# Patient Record
Sex: Female | Born: 1935 | Race: White | Hispanic: No | Smoking: Current every day smoker
Health system: Southern US, Community
[De-identification: ages and names within clinical notes are randomized; demographics above are authoritative.]

## PROBLEM LIST (undated history)

## (undated) DIAGNOSIS — Z950 Presence of cardiac pacemaker: Secondary | ICD-10-CM

## (undated) DIAGNOSIS — J449 Chronic obstructive pulmonary disease, unspecified: Secondary | ICD-10-CM

## (undated) DIAGNOSIS — I10 Essential (primary) hypertension: Secondary | ICD-10-CM

## (undated) DIAGNOSIS — I509 Heart failure, unspecified: Secondary | ICD-10-CM

## (undated) DIAGNOSIS — I4891 Unspecified atrial fibrillation: Secondary | ICD-10-CM

## (undated) DIAGNOSIS — N189 Chronic kidney disease, unspecified: Secondary | ICD-10-CM

## (undated) HISTORY — PX: HIP ARTHROPLASTY: SHX981

## (undated) HISTORY — PX: PACEMAKER PLACEMENT: SHX43

---

## 2003-10-03 ENCOUNTER — Other Ambulatory Visit: Payer: Self-pay

## 2003-10-04 ENCOUNTER — Other Ambulatory Visit: Payer: Self-pay

## 2003-10-05 ENCOUNTER — Other Ambulatory Visit: Payer: Self-pay

## 2003-11-15 ENCOUNTER — Ambulatory Visit: Payer: Self-pay | Admitting: Internal Medicine

## 2003-11-19 ENCOUNTER — Inpatient Hospital Stay: Payer: Self-pay | Admitting: Cardiology

## 2003-11-19 ENCOUNTER — Other Ambulatory Visit: Payer: Self-pay

## 2003-11-20 ENCOUNTER — Other Ambulatory Visit: Payer: Self-pay

## 2004-02-10 ENCOUNTER — Ambulatory Visit: Payer: Self-pay | Admitting: General Surgery

## 2004-07-08 ENCOUNTER — Ambulatory Visit: Payer: Self-pay | Admitting: Internal Medicine

## 2005-05-19 ENCOUNTER — Ambulatory Visit: Payer: Self-pay | Admitting: Unknown Physician Specialty

## 2005-09-02 ENCOUNTER — Ambulatory Visit: Payer: Self-pay | Admitting: Internal Medicine

## 2006-08-17 ENCOUNTER — Ambulatory Visit: Payer: Self-pay | Admitting: Ophthalmology

## 2006-08-17 ENCOUNTER — Other Ambulatory Visit: Payer: Self-pay

## 2006-08-24 ENCOUNTER — Ambulatory Visit: Payer: Self-pay | Admitting: Ophthalmology

## 2006-10-27 ENCOUNTER — Ambulatory Visit: Payer: Self-pay | Admitting: Internal Medicine

## 2007-10-30 ENCOUNTER — Ambulatory Visit: Payer: Self-pay | Admitting: Internal Medicine

## 2008-10-31 ENCOUNTER — Ambulatory Visit: Payer: Self-pay | Admitting: Internal Medicine

## 2010-01-20 ENCOUNTER — Ambulatory Visit: Payer: Self-pay | Admitting: Internal Medicine

## 2011-02-13 ENCOUNTER — Inpatient Hospital Stay: Payer: Self-pay | Admitting: Internal Medicine

## 2011-02-13 LAB — COMPREHENSIVE METABOLIC PANEL
Anion Gap: 11 (ref 7–16)
Bilirubin,Total: 0.5 mg/dL (ref 0.2–1.0)
EGFR (African American): >60
Glucose: 148 mg/dL — ABNORMAL HIGH (ref 65–99)
Osmolality: 281 (ref 275–301)
Potassium: 3.2 mmol/L — ABNORMAL LOW (ref 3.5–5.1)
Sodium: 138 mmol/L (ref 136–145)
Total Protein: 7.7 g/dL (ref 6.4–8.2)

## 2011-02-13 LAB — CBC
HCT: 45.3 % (ref 35.0–47.0)
HGB: 15.7 g/dL (ref 12.0–16.0)
MCH: 31 pg (ref 26.0–34.0)
MCV: 90 fL (ref 80–100)
RBC: 5.05 10*6/uL (ref 3.80–5.20)

## 2011-02-14 LAB — CK-MB
CK-MB: 5.4 ng/mL — ABNORMAL HIGH (ref 0.5–3.6)
CK-MB: 5.9 ng/mL — ABNORMAL HIGH (ref 0.5–3.6)
CK-MB: 6.7 ng/mL — ABNORMAL HIGH (ref 0.5–3.6)

## 2011-02-14 LAB — TROPONIN I
Troponin-I: 0.13 ng/mL — ABNORMAL HIGH
Troponin-I: 0.11 ng/mL — ABNORMAL HIGH

## 2011-02-15 LAB — BASIC METABOLIC PANEL
Calcium, Total: 9.2 mg/dL (ref 8.5–10.1)
Chloride: 101 mmol/L (ref 98–107)
Co2: 30 mmol/L (ref 21–32)
Osmolality: 288 (ref 275–301)
Potassium: 3.7 mmol/L (ref 3.5–5.1)

## 2011-02-15 LAB — CBC WITH DIFFERENTIAL/PLATELET
Basophil %: 0.3 %
Eosinophil %: 0 %
HCT: 40.5 % (ref 35.0–47.0)
Lymphocyte #: 1.4 10*3/uL (ref 1.0–3.6)
MCH: 29.9 pg (ref 26.0–34.0)
MCHC: 33 g/dL (ref 32.0–36.0)
MCV: 91 fL (ref 80–100)
Monocyte #: 0.8 10*3/uL — ABNORMAL HIGH (ref 0.0–0.7)
Monocyte %: 7.5 %
RDW: 15.8 % — ABNORMAL HIGH (ref 11.5–14.5)

## 2011-02-15 LAB — DIGOXIN LEVEL: Digoxin: <0.1 ng/mL — ABNORMAL LOW

## 2011-02-15 LAB — CLOSTRIDIUM DIFFICILE BY PCR

## 2011-02-17 LAB — BASIC METABOLIC PANEL
BUN: 29 mg/dL — ABNORMAL HIGH (ref 7–18)
Calcium, Total: 9.7 mg/dL (ref 8.5–10.1)
Chloride: 105 mmol/L (ref 98–107)
Creatinine: 1.2 mg/dL (ref 0.60–1.30)
EGFR (African American): 55 — ABNORMAL LOW
EGFR (Non-African Amer.): 45 — ABNORMAL LOW
Glucose: 115 mg/dL — ABNORMAL HIGH (ref 65–99)
Potassium: 3.5 mmol/L (ref 3.5–5.1)
Sodium: 142 mmol/L (ref 136–145)

## 2011-02-17 LAB — CBC WITH DIFFERENTIAL/PLATELET
Basophil #: 0 10*3/uL (ref 0.0–0.1)
Basophil %: 0 %
Eosinophil %: 0 %
HGB: 12.6 g/dL (ref 12.0–16.0)
Lymphocyte #: 0.9 10*3/uL — ABNORMAL LOW (ref 1.0–3.6)
Lymphocyte %: 6.6 %
Monocyte %: 4.3 %
Neutrophil %: 89.1 %
Platelet: 162 10*3/uL (ref 150–440)
RBC: 4.22 10*6/uL (ref 3.80–5.20)
RDW: 16.2 % — ABNORMAL HIGH (ref 11.5–14.5)
WBC: 13 10*3/uL — ABNORMAL HIGH (ref 3.6–11.0)

## 2011-02-22 ENCOUNTER — Ambulatory Visit: Payer: Self-pay | Admitting: Internal Medicine

## 2012-05-02 ENCOUNTER — Ambulatory Visit: Payer: Self-pay | Admitting: Internal Medicine

## 2013-10-09 ENCOUNTER — Ambulatory Visit: Payer: Self-pay | Admitting: Family Medicine

## 2014-05-26 NOTE — H&P (Signed)
PATIENT NAME:  Erin Macias, Erin Macias MR#:  [id number] DATE OF BIRTH:  Dec 11, 1923  DATE OF ADMISSION:  02/13/2011  PRIMARY CARE PHYSICIAN: Alonna BucklerAndrew Lamb, MD   CARDIOLOGIST: Dr. Gwen PoundsKowalski   CHIEF COMPLAINT: Congestion and cough for 2 to 3 days.   HISTORY OF PRESENT ILLNESS: Erin Macias is an 81 year old Caucasian female with past medical history of hypertension, glaucoma, and history of sick sinus syndrome status post dual-chamber pacemaker in 2005 and nonsignificant coronary artery disease by cardiac cath 11/2003 who comes into the Emergency Room accompanied by family members with the above-mentioned chief complaint. The patient said in the early part of December she had the cold and cough symptoms, got better, however, for the past two days she has been having symptoms of sinus congestion, myalgias, low-grade fever along with dry cough with which she is not able to bring up any phlegm. She feels quite stuffed up. She called Dr. Jasper LoserLamb's office and Z-Pak was called in. She has taken two days of medication, not feeling better, and came to the Emergency Room where chest x-ray is negative for pneumonia. She was, however, hypoxic when she was taken off her oxygen. Sats dropped down into the upper 80's. She is being admitted for flulike symptoms along with hypoxemia.   The patient is a chronic smoker, about 5 to 7 cigarettes a day, for many years. She has no official diagnosis of chronic obstructive pulmonary disease. She denies any wheezing.   PAST MEDICAL HISTORY:  1. Sick sinus syndrome status post pacemaker in 11/2003 along with nonsignificant coronary artery disease by cardiac cath 11/2003, followed by Dr. Gwen PoundsKowalski.  2. Hypertension.  3. Moderate segmental systolic dysfunction with valvular heart disease.  4. Hypertension.  5. Glaucoma.  6. Hyperlipidemia.  7. Osteoporosis.  8. History of basal cell carcinoma.   PAST SURGICAL HISTORY:  1. Right breast biopsy.  2. Bunionectomy.  3. Tonsillectomy.   4. Cervical laminectomy.  5. Pacemaker placement.   MEDICATIONS:   1. Amlodipine 10 mg daily.  2. Lisinopril/hydrochlorothiazide 10/12.5 2 tablets daily.  3. MVI daily.  4. Toprol-XL 100 mg daily.  5. Aldactone 25 mg daily. This was just started on 02/11/2011 by Dr. Gwen PoundsKowalski. The patient has not started to take it yet.  6. Aspirin 81 mg daily.  7. Calcium 600 b.i.d.  8. Evista 60 mg daily.  FAMILY HISTORY: Father died of colon cancer. Mother died in her 9270's. She had breast cancer, hypertension, arthritis. There is history of CVA positive in the family.   SOCIAL HISTORY: She is retired. Smokes about 5 to 6 cigarettes a day for many years. Has 2 or 3 cups of caffeinated coffee a day.   REVIEW OF SYSTEMS: Positive for fever, fatigue. EYES: No blurred or double vision. ENT: Positive for sinus congestion and cough. RESPIRATORY: Positive for cough and shortness of breath. CARDIOVASCULAR: No chest pain. Positive for hypertension. GI: No nausea, vomiting, diarrhea, or abdominal pain. GU: No dysuria or hematuria. ENDOCRINE: No polyuria or nocturia. HEMATOLOGY: No anemia or easy bruising. SKIN: No acne or rash. MUSCULOSKELETAL: Positive for arthritis. NEUROLOGIC: No cerebrovascular accident or transient ischemic attack. PSYCH: No anxiety or depression. All other systems reviewed and negative.   PHYSICAL EXAMINATION:   GENERAL: The patient is awake. She is alert, she is oriented, not in acute distress.   VITAL SIGNS: Afebrile, pulse 98, blood pressure 135/65. Her sats are 98% on 2 liters.   HEENT: Atraumatic, normocephalic. The patient does have sinus congestion bilaterally with swollen eyelids  and hoarse voice.   NECK: Supple. No JVD. No carotid bruit.   RESPIRATORY: Mildly upper airway coarse breath sounds heard. No wheezing or crackles.   CARDIOVASCULAR: Tachycardia present. No murmur heard. PMI not lateralized. Chest nontender.   EXTREMITIES: Good pedal pulses. Good femoral pulses. No  lower extremity edema.   ABDOMEN: Soft, benign, nontender. No organomegaly. Positive bowel sounds.   NEUROLOGIC: Grossly intact cranial nerves II through XII. No motor or sensory deficit.   PSYCHIATRIC: The patient is awake, alert, oriented x3.   SKIN: Warm and dry.   LABORATORY, DIAGNOSTIC, AND RADIOLOGICAL DATA: Influenza A + B is negative. CBC within normal limits. Platelet count 140. Glucose 148. BUN 19. Creatinine 1. Sodium 138. Potassium 3.2. Chloride 98. SGOT 46. Chest x-ray subtle reticular opacities in the periphery of the lungs are nonspecific. This may represent interstitial edema. Early infection would be difficult to exclude. Chronic interstitial lung disease is a differential consideration.   ASSESSMENT: 81 year old Ms. Boomhower with:  1. Upper respiratory infection/flulike illness with negative Influenza in the ER.  2. Acute bronchitis.  3. Hypoxia with history of chronic smoking. No formal diagnosis of chronic obstructive pulmonary disease.  4. Hypertension.  5. Sick sinus syndrome status post pacemaker in 2005.  6. Cardiomyopathy with EF of 35% by echo.   PLAN:  1. Admit patient to medical floor.  2. Regular diet.  3. Will continue the patient's Zithromax.  4. Add benzonatate and Mucinex for congestion and cough.  5. Continue all home medications.  6. P.r.n. oral inhalers. 7. Wean oxygen as you can.  8. There is no indication for high doses of IV Solu-Medrol. The patient is not wheezing and currently not in respiratory distress. This appears to be all upper respiratory infection. I will hold off on it for now.  9. Send sputum culture.  10. Further work-up according to the patient's clinical course.   Hospital admission plan was discussed with the patient and her family members.   TIME SPENT: 45 minutes.  ____________________________ Wylie Hail Allena Katz, MD sap:drc D: 02/13/2011 12:14:18 ET T: 02/13/2011 12:34:54 ET JOB#: [id number]  cc: Darcel Frane A. Allena Katz, MD,  <Dictator> Reola Mosher. Randa Lynn, MD Willow Ora MD ELECTRONICALLY SIGNED 02/16/2011 15:25

## 2014-05-26 NOTE — Discharge Summary (Signed)
PATIENT NAME:  Erin Macias, TANKARD MR#:  [id number] DATE OF BIRTH:  04-08-1923  DATE OF ADMISSION:  02/13/2011 DATE OF DISCHARGE:  02/18/2011  DISCHARGE DIAGNOSES:  1. Community-acquired pneumonia with chronic obstructive pulmonary disease exacerbation.  2. Atrial fibrillation with rapid ventricular response because of pneumonia and chronic obstructive pulmonary disease, resolved, now in sinus rhythm.  3. Hypertension.  4. Hyperlipidemia.  5. Sick sinus syndrome.  6. Coronary artery disease.  DISCHARGE MEDICATIONS:  1. Aspirin 81 mg p.o. daily.  2. Amlodipine 5 mg p.o. daily.  3. HCTZ/lisinopril 12.5/10 mg p.o. b.i.d.  4. Spiriva 1 capsule inhalation daily. 5. Advair 250/50, 1 puff b.i.d.  6. Augmentin 875 mg p.o. b.i.d. for five days. 7. Prednisone taper as instructed.  8. Lopressor 50 mg every 12 hours. 9. Cardizem CD 120 mg p.o. daily.  10. Patient actually was given  tussiones but  she is allergic to codeine so the Tussionex prescription is discarded.    DIET: Low sodium diet.   FOLLOW UP: Patient has follow-up appointment with Dr. [name] on 30th at 9:15 a.m. and also primary doctor, Dr. Randa Lynn, she will see him  on 01/31    CONSULTATION: Cardiology consult with Dr. [name].    HOSPITAL COURSE: 81 year old female patient who has a history of hypertension and sick sinus syndrome, hyperlipidemia and history of renal cell carcinoma and tobacco abuse came in because of:  1. Cough, chest congestion and flulike symptoms. Patient also had saturations in upper 80s on room air. She was given Z-Pak by primary doctor because of persistent symptoms. Patient was brought to the Emergency Room. She has history of pipe smoking about 5 to 7 cigarettes a day. When she came in her heart rate was also elevated. She was admitted to hospitalist service for an acute respiratory infection, flulike symptoms and acute bronchitis. Started on IV antibiotics with Rocephin and Zithromax along with nebulizers and  steroids. Patient's influenza test was negative but she was started on Tamiflu because of the symptoms. Also continued on oxygen. Patient was seen by Dr. [name] from cardiology. Initially she was seen by Dr. Darrold Junker and Dr. [name] saw the patient. Patient is transferred to Critical Care Unit because of atrial fibrillation with rapid ventricular response and was started on Cardizem drip. Patient's atrial fibrillation was thought to be because of her respiratory distress and Cardizem is changed to 60 q.6 and gradually she is on Cardizem CD 120 mg daily which is according to the cardiologist recommendations and she is also continued on Lopressor. Heart rate has been stabilized prior to discharge. Anticoagulation was deferred because of paroxysmal atrial fibrillation and patient's ejection fraction showed more than 50%.  2. Regarding her pneumonia and chronic obstructive pulmonary disease she also had CT of the chest done which showed emphysematous lungs and also left upper lobe inferolaterally and superior segment of left lower lobe, minimal subpleural smoothly rounded calcified nodule present in the left upper lobe and mediastinal lymphadenopathy present. Mild bronchiectasis present. Borderline cardiomegaly. Patient found to have patchy infiltrates in the left upper lobe e, so she was discharged with Spiriva, Augmentin, along with inhalers, albuterol and Advair. Patient also remained saturations above 90s on ambulation on room air so she did not require any oxygen. Also advised to stop smoking for her tobacco abuse.  3. Patient also has some electrolyte abnormality during the hospital stay which were replaced which improved hypokalemia .Marland Kitchen Patient also seen by physical therapist. Patient refused home physical therapy and also other services at  home and she was discharged to home.   DISCHARGE VITALS: Temperature 97.7, pulse 74, blood pressure 120/72, saturations 94% on room air.   CONDITION ON DISCHARGE: She  was discharged home in stable condition.   TIME SPENT ON DISCHARGE PREPARATION: More than 30 minutes.   ____________________________ Katha HammingSnehalatha Eiza Canniff, MD sk:cms D: 02/22/2011 22:45:00 ET T: 02/24/2011 09:09:13 ET JOB#: [id number]  cc: Katha HammingSnehalatha Reynolds Kittel, MD, <Dictator> Reola MosherAndrew S. Randa LynnLamb, MD  Katha HammingSNEHALATHA Mico Spark MD ELECTRONICALLY SIGNED 03/08/2011 13:35

## 2014-05-26 NOTE — Consult Note (Signed)
Brief Consult Note: Diagnosis: AF with RVR, converted to sinus on dilt drip.   Patient was seen by consultant.   Consult note dictated.   Comments: REC  Agree with current therapy, cont dilt drip, if remains in NSR, start cardizem 60mg  mQ6, then taper dilt drip to off, hold dig, defer chronic anticoagulation, review echo.  Electronic Signatures: Marcina MillardParaschos, Mandrell Vangilder (MD)  (Signed 13-Jan-13 10:31)  Authored: Brief Consult Note   Last Updated: 13-Jan-13 10:31 by Marcina MillardParaschos, Alesandro Stueve (MD)

## 2014-05-26 NOTE — Consult Note (Signed)
PATIENT NAME:  Erin Macias, FREDERIC MR#:  [id number] DATE OF BIRTH:  10-24-1923  DATE OF CONSULTATION:  02/14/2011  REFERRING PHYSICIAN:   CONSULTING PHYSICIAN:  Marcina Millard, MD  PRIMARY CARE PHYSICIAN: Alonna Buckler, MD   CARDIOLOGIST: Arnoldo Hooker, MD   CHIEF COMPLAINT: Cough.   HISTORY OF PRESENT ILLNESS: The patient is an 81 year old female referred for evaluation of atrial fibrillation with a rapid ventricular response. The patient has a prior history of atrial fibrillation, sick sinus syndrome status post dual-chamber pacemaker. she was in her usual state of health until two days prior to admission when she started to develop increasing low-grade fever, dry cough, myalgias, and nausea with decreased p.o. intake. The patient started Z-Pak as an outpatient without effect. She presented to [facility] Emergency Room where she was noted to be hypoxic and admitted with a viral syndrome. Following admission, the patient developed atrial fibrillation with a rapid ventricular response and was transferred to the CCU where she has been treated with diltiazem drip and currently has converted to sinus rhythm with intermittent atrial sensing with ventricular pacing. The patient has ruled out for myocardial infarction with negative troponin. She currently denies chest pain, shortness of breath, or tachycardia.   PAST MEDICAL HISTORY:  1. Intermittent atrial fibrillation.  2. Sick sinus syndrome status post dual-chamber pacemaker. 3. Insignificant coronary artery disease by cardiac catheterization 11/2003.  4. Hypertension.  5. Moderately reduced left ventricular function.  6. Hyperlipidemia.   MEDICATIONS ON ADMISSION:  1. Amlodipine 10 mg daily.  2. Lisinopril/HCTZ 10/12.5 2 tabs daily.  3. Toprol XL 100 mg daily.  4. Aldactone 25 mg daily.  5. Aspirin 81 mg daily.  6. Multivite 1 daily.  7. Calcium 600 mg b.i.d.  8. Evista 60 mg daily.   SOCIAL HISTORY: The patient is retired. She lives alone.  She smokes 5 to 6 cigarettes per day.   FAMILY HISTORY: No immediate family history for myocardial infarction or coronary artery disease.   REVIEW OF SYSTEMS: CONSTITUTIONAL: Positive for fever and chills. EYES: No blurry vision. EARS: No hearing loss. RESPIRATORY: Dry nonproductive cough. CARDIOVASCULAR: No chest pain. GI: Recent nausea. No vomiting, diarrhea, or constipation. GU: No dysuria or hematuria. ENDOCRINE: No polyuria or polydipsia. MUSCULOSKELETAL: Positive for arthralgias and myalgias. NEUROLOGIC: No focal muscle weakness or numbness. PSYCHOLOGICAL: No anxiety or depression.   PHYSICAL EXAMINATION:   VITAL SIGNS: Blood pressure 127/50, pulse 98, respirations 28, temperature 98.2, pulse oximetry 94%.   HEENT: Pupils equal, reactive to light and accommodation.   NECK: Supple without thyromegaly.   LUNGS: Clear.   CARDIOVASCULAR: Normal JVP. Normal PMI. Regular rate and rhythm. Normal S1, S2. No appreciable gallop, murmur, or rub.   ABDOMEN: Soft, nontender. Pulses were intact bilaterally.   MUSCULOSKELETAL: Normal muscle tone.   NEUROLOGIC: The patient is alert and oriented x3. Motor and sensory both grossly intact.   IMPRESSION: This is an 81 year old female with prior history of intermittent atrial fibrillation, sick sinus syndrome status post dual-chamber pacemaker admitted with viral syndrome with brief episode of atrial fibrillation with rapid ventricular response converted to sinus rhythm.   RECOMMENDATIONS:  1. Agree with overall current therapy.  2. Continue diltiazem drip for now.  3. If the patient remains in sinus rhythm, start Cardizem 60 mg p.o. q.6 then diltiazem drip to off. 4. Discontinue amlodipine.  5. Will defer chronic anticoagulation at this time since the patient developed atrial fibrillation during a reversible illness.  6. Review 2-D echocardiogram.  7. Would recommend  not starting digoxin at this time since the patient is in sinus rhythm.   8. Further recommendations pending echocardiogram results.   ____________________________ Marcina MillardAlexander Jazlene Bares, MD ap:drc D: 02/14/2011 10:27:18 ET T: 02/14/2011 11:15:59 ET JOB#: [id number]  cc: Marcina MillardAlexander Oreoluwa Aigner, MD, <Dictator> Marcina MillardALEXANDER Clarrissa Shimkus MD ELECTRONICALLY SIGNED 02/25/2011 12:37

## 2015-07-31 ENCOUNTER — Emergency Department: Payer: Medicare Other

## 2015-07-31 ENCOUNTER — Encounter: Payer: Self-pay | Admitting: *Deleted

## 2015-07-31 ENCOUNTER — Observation Stay
Admission: EM | Admit: 2015-07-31 | Discharge: 2015-08-02 | Disposition: A | Discharge status: Home / Self Care | Payer: Medicare Other | Attending: Internal Medicine | Admitting: Internal Medicine

## 2015-07-31 DIAGNOSIS — I509 Heart failure, unspecified: Secondary | ICD-10-CM

## 2015-07-31 DIAGNOSIS — I495 Sick sinus syndrome: Secondary | ICD-10-CM | POA: Diagnosis not present

## 2015-07-31 DIAGNOSIS — R778 Other specified abnormalities of plasma proteins: Secondary | ICD-10-CM | POA: Insufficient documentation

## 2015-07-31 DIAGNOSIS — Z85528 Personal history of other malignant neoplasm of kidney: Secondary | ICD-10-CM | POA: Diagnosis not present

## 2015-07-31 DIAGNOSIS — I2510 Atherosclerotic heart disease of native coronary artery without angina pectoris: Secondary | ICD-10-CM | POA: Insufficient documentation

## 2015-07-31 DIAGNOSIS — Z79899 Other long term (current) drug therapy: Secondary | ICD-10-CM | POA: Diagnosis not present

## 2015-07-31 DIAGNOSIS — N179 Acute kidney failure, unspecified: Secondary | ICD-10-CM | POA: Insufficient documentation

## 2015-07-31 DIAGNOSIS — I110 Hypertensive heart disease with heart failure: Secondary | ICD-10-CM | POA: Insufficient documentation

## 2015-07-31 DIAGNOSIS — I083 Combined rheumatic disorders of mitral, aortic and tricuspid valves: Secondary | ICD-10-CM | POA: Diagnosis not present

## 2015-07-31 DIAGNOSIS — Z950 Presence of cardiac pacemaker: Secondary | ICD-10-CM | POA: Diagnosis not present

## 2015-07-31 DIAGNOSIS — Z885 Allergy status to narcotic agent status: Secondary | ICD-10-CM | POA: Insufficient documentation

## 2015-07-31 DIAGNOSIS — I5033 Acute on chronic diastolic (congestive) heart failure: Secondary | ICD-10-CM | POA: Diagnosis not present

## 2015-07-31 DIAGNOSIS — F17210 Nicotine dependence, cigarettes, uncomplicated: Secondary | ICD-10-CM | POA: Diagnosis not present

## 2015-07-31 DIAGNOSIS — Z7982 Long term (current) use of aspirin: Secondary | ICD-10-CM | POA: Insufficient documentation

## 2015-07-31 DIAGNOSIS — E785 Hyperlipidemia, unspecified: Secondary | ICD-10-CM | POA: Diagnosis not present

## 2015-07-31 DIAGNOSIS — R0600 Dyspnea, unspecified: Secondary | ICD-10-CM

## 2015-07-31 DIAGNOSIS — N289 Disorder of kidney and ureter, unspecified: Secondary | ICD-10-CM

## 2015-07-31 DIAGNOSIS — M8580 Other specified disorders of bone density and structure, unspecified site: Secondary | ICD-10-CM | POA: Diagnosis not present

## 2015-07-31 DIAGNOSIS — J441 Chronic obstructive pulmonary disease with (acute) exacerbation: Principal | ICD-10-CM | POA: Insufficient documentation

## 2015-07-31 DIAGNOSIS — R7989 Other specified abnormal findings of blood chemistry: Secondary | ICD-10-CM

## 2015-07-31 DIAGNOSIS — I10 Essential (primary) hypertension: Secondary | ICD-10-CM

## 2015-07-31 HISTORY — DX: Chronic obstructive pulmonary disease, unspecified: J44.9

## 2015-07-31 HISTORY — DX: Presence of cardiac pacemaker: Z95.0

## 2015-07-31 HISTORY — DX: Heart failure, unspecified: I50.9

## 2015-07-31 LAB — CBC WITH DIFFERENTIAL/PLATELET
Basophils Absolute: 0.1 10*3/uL (ref 0–0.1)
Basophils Relative: 1 %
EOS ABS: 0 10*3/uL (ref 0–0.7)
Eosinophils Relative: 0 %
HEMATOCRIT: 46.7 % (ref 35.0–47.0)
HEMOGLOBIN: 15.6 g/dL (ref 12.0–16.0)
LYMPHS ABS: 1.2 10*3/uL (ref 1.0–3.6)
Lymphocytes Relative: 13 %
MCH: 30.1 pg (ref 26.0–34.0)
MCHC: 33.3 g/dL (ref 32.0–36.0)
MCV: 90.4 fL (ref 80.0–100.0)
MONO ABS: 0.7 10*3/uL (ref 0.2–0.9)
MONOS PCT: 7 %
NEUTROS ABS: 7.5 10*3/uL — AB (ref 1.4–6.5)
NEUTROS PCT: 79 %
Platelets: 175 10*3/uL (ref 150–440)
RBC: 5.17 MIL/uL (ref 3.80–5.20)
RDW: 17.2 % — AB (ref 11.5–14.5)
WBC: 9.4 10*3/uL (ref 3.6–11.0)

## 2015-07-31 LAB — COMPREHENSIVE METABOLIC PANEL
ALT: 35 U/L (ref 14–54)
AST: 55 U/L — AB (ref 15–41)
Albumin: 4.1 g/dL (ref 3.5–5.0)
Alkaline Phosphatase: 58 U/L (ref 38–126)
Anion gap: 10 (ref 5–15)
BILIRUBIN TOTAL: 1.5 mg/dL — AB (ref 0.3–1.2)
BUN: 54 mg/dL — AB (ref 6–20)
CALCIUM: 9.8 mg/dL (ref 8.9–10.3)
CO2: 28 mmol/L (ref 22–32)
CREATININE: 1.66 mg/dL — AB (ref 0.44–1.00)
Chloride: 101 mmol/L (ref 101–111)
GFR, EST AFRICAN AMERICAN: 30 mL/min — AB (ref >60)
GFR, EST NON AFRICAN AMERICAN: 26 mL/min — AB (ref >60)
Glucose, Bld: 97 mg/dL (ref 65–99)
Potassium: 4.4 mmol/L (ref 3.5–5.1)
Sodium: 139 mmol/L (ref 135–145)
TOTAL PROTEIN: 6.6 g/dL (ref 6.5–8.1)

## 2015-07-31 LAB — BRAIN NATRIURETIC PEPTIDE: B Natriuretic Peptide: 2449 pg/mL — ABNORMAL HIGH (ref 0.0–100.0)

## 2015-07-31 LAB — CREATININE, SERUM
Creatinine, Ser: 1.81 mg/dL — ABNORMAL HIGH (ref 0.44–1.00)
GFR, EST AFRICAN AMERICAN: 27 mL/min — AB (ref >60)
GFR, EST NON AFRICAN AMERICAN: 23 mL/min — AB (ref >60)

## 2015-07-31 LAB — CBC
HEMATOCRIT: 44 % (ref 35.0–47.0)
HEMOGLOBIN: 14.4 g/dL (ref 12.0–16.0)
MCH: 30.1 pg (ref 26.0–34.0)
MCHC: 32.7 g/dL (ref 32.0–36.0)
MCV: 92.2 fL (ref 80.0–100.0)
Platelets: 142 10*3/uL — ABNORMAL LOW (ref 150–440)
RBC: 4.77 MIL/uL (ref 3.80–5.20)
RDW: 16.9 % — ABNORMAL HIGH (ref 11.5–14.5)
WBC: 6.9 10*3/uL (ref 3.6–11.0)

## 2015-07-31 LAB — FIBRIN DERIVATIVES D-DIMER (ARMC ONLY): FIBRIN DERIVATIVES D-DIMER (ARMC): 808 — AB (ref 0–499)

## 2015-07-31 LAB — TROPONIN I
Troponin I: 0.04 ng/mL (ref <0.03)
Troponin I: 0.05 ng/mL (ref <0.03)
Troponin I: 0.04 ng/mL (ref <0.03)

## 2015-07-31 MED ORDER — SODIUM CHLORIDE 0.9 % IV SOLN: 250.0000 mL | INTRAVENOUS | Status: DC | PRN

## 2015-07-31 MED ORDER — ACETAMINOPHEN 325 MG PO TABS
650.0000 mg | ORAL_TABLET | Freq: Four times a day (QID) | ORAL | Status: DC | PRN
  Administered 2015-07-31 – 2015-08-01 (×2): 650 mg via ORAL
  Filled 2015-07-31 (×2): qty 1

## 2015-07-31 MED ORDER — ACETAMINOPHEN 650 MG RE SUPP: 650.0000 mg | Freq: Four times a day (QID) | RECTAL | Status: DC | PRN

## 2015-07-31 MED ORDER — CARVEDILOL 6.25 MG PO TABS: 3.1250 mg | ORAL_TABLET | Freq: Two times a day (BID) | ORAL | Status: DC

## 2015-07-31 MED ORDER — IPRATROPIUM-ALBUTEROL 0.5-2.5 (3) MG/3ML IN SOLN: 3.0000 mL | RESPIRATORY_TRACT | Status: DC | PRN

## 2015-07-31 MED ORDER — ASPIRIN EC 325 MG PO TBEC
325.0000 mg | DELAYED_RELEASE_TABLET | Freq: Every day | ORAL | Status: DC
  Administered 2015-07-31 – 2015-08-02 (×3): 325 mg via ORAL
  Filled 2015-07-31 (×3): qty 1

## 2015-07-31 MED ORDER — LEVOFLOXACIN 500 MG PO TABS
750.0000 mg | ORAL_TABLET | ORAL | Status: DC
  Administered 2015-07-31: 750 mg via ORAL
  Filled 2015-07-31: qty 2

## 2015-07-31 MED ORDER — ASPIRIN EC 81 MG PO TBEC: 81.0000 mg | DELAYED_RELEASE_TABLET | Freq: Every day | ORAL | Status: DC

## 2015-07-31 MED ORDER — SODIUM CHLORIDE 0.9% FLUSH
3.0000 mL | Freq: Two times a day (BID) | INTRAVENOUS | Status: DC
  Administered 2015-07-31 – 2015-08-02 (×4): 3 mL via INTRAVENOUS

## 2015-07-31 MED ORDER — ONDANSETRON HCL 4 MG PO TABS: 4.0000 mg | ORAL_TABLET | Freq: Four times a day (QID) | ORAL | Status: DC | PRN

## 2015-07-31 MED ORDER — CARVEDILOL 3.125 MG PO TABS
3.1250 mg | ORAL_TABLET | Freq: Two times a day (BID) | ORAL | Status: DC
  Administered 2015-07-31: 3.125 mg via ORAL
  Filled 2015-07-31: qty 1

## 2015-07-31 MED ORDER — BUDESONIDE 0.25 MG/2ML IN SUSP
0.2500 mg | Freq: Two times a day (BID) | RESPIRATORY_TRACT | Status: DC
  Administered 2015-07-31 – 2015-08-02 (×5): 0.25 mg via RESPIRATORY_TRACT
  Filled 2015-07-31 (×6): qty 2

## 2015-07-31 MED ORDER — ENOXAPARIN SODIUM 30 MG/0.3ML ~~LOC~~ SOLN
30.0000 mg | SUBCUTANEOUS | Status: DC
  Administered 2015-07-31: 30 mg via SUBCUTANEOUS
  Filled 2015-07-31: qty 0.3

## 2015-07-31 MED ORDER — NICOTINE 7 MG/24HR TD PT24
7.0000 mg | MEDICATED_PATCH | Freq: Every day | TRANSDERMAL | Status: DC
  Filled 2015-07-31 (×3): qty 1

## 2015-07-31 MED ORDER — SODIUM CHLORIDE 0.9% FLUSH: 3.0000 mL | INTRAVENOUS | Status: DC | PRN

## 2015-07-31 MED ORDER — IPRATROPIUM-ALBUTEROL 0.5-2.5 (3) MG/3ML IN SOLN
3.0000 mL | RESPIRATORY_TRACT | Status: DC
  Administered 2015-07-31 (×2): 3 mL via RESPIRATORY_TRACT
  Filled 2015-07-31: qty 3

## 2015-07-31 MED ORDER — NITROGLYCERIN 2 % TD OINT
1.0000 [in_us] | TOPICAL_OINTMENT | Freq: Four times a day (QID) | TRANSDERMAL | Status: DC
  Administered 2015-07-31 – 2015-08-01 (×4): 1 [in_us] via TOPICAL
  Filled 2015-07-31 (×5): qty 1

## 2015-07-31 MED ORDER — LISINOPRIL 20 MG PO TABS
30.0000 mg | ORAL_TABLET | Freq: Every day | ORAL | Status: DC
  Administered 2015-07-31 – 2015-08-02 (×3): 30 mg via ORAL
  Filled 2015-07-31 (×3): qty 1

## 2015-07-31 MED ORDER — DIPHENHYDRAMINE HCL 25 MG PO CAPS
25.0000 mg | ORAL_CAPSULE | Freq: Every evening | ORAL | Status: DC | PRN
  Administered 2015-07-31 – 2015-08-01 (×2): 25 mg via ORAL
  Filled 2015-07-31 (×2): qty 1

## 2015-07-31 MED ORDER — CARVEDILOL 6.25 MG PO TABS
6.2500 mg | ORAL_TABLET | Freq: Once | ORAL | Status: AC
  Administered 2015-07-31: 6.25 mg via ORAL
  Filled 2015-07-31: qty 1

## 2015-07-31 MED ORDER — ONDANSETRON HCL 4 MG/2ML IJ SOLN: 4.0000 mg | Freq: Four times a day (QID) | INTRAMUSCULAR | Status: DC | PRN

## 2015-07-31 MED ORDER — HYDRALAZINE HCL 25 MG PO TABS
25.0000 mg | ORAL_TABLET | Freq: Three times a day (TID) | ORAL | Status: DC
  Filled 2015-07-31 (×2): qty 1

## 2015-07-31 MED ORDER — ALBUTEROL SULFATE (2.5 MG/3ML) 0.083% IN NEBU: 2.5000 mg | INHALATION_SOLUTION | Freq: Four times a day (QID) | RESPIRATORY_TRACT | Status: DC

## 2015-07-31 MED ORDER — SODIUM CHLORIDE 0.9 % IV SOLN: Freq: Once | INTRAVENOUS | Status: DC

## 2015-07-31 MED ORDER — SODIUM CHLORIDE 0.9% FLUSH
3.0000 mL | Freq: Two times a day (BID) | INTRAVENOUS | Status: DC
  Administered 2015-08-02: 3 mL via INTRAVENOUS

## 2015-07-31 MED ORDER — METHYLPREDNISOLONE SODIUM SUCC 125 MG IJ SOLR
60.0000 mg | Freq: Two times a day (BID) | INTRAMUSCULAR | Status: DC
  Administered 2015-07-31 – 2015-08-01 (×2): 60 mg via INTRAVENOUS
  Filled 2015-07-31 (×2): qty 2

## 2015-07-31 MED ORDER — CARVEDILOL 12.5 MG PO TABS
12.5000 mg | ORAL_TABLET | Freq: Two times a day (BID) | ORAL | Status: DC
Start: 2015-08-01 — End: 2015-08-02
  Administered 2015-08-01 – 2015-08-02 (×3): 12.5 mg via ORAL
  Filled 2015-07-31 (×3): qty 1

## 2015-07-31 NOTE — ED Notes (Signed)
Informed RN bed ready  [id number]

## 2015-07-31 NOTE — ED Notes (Signed)
Pt brought in via [facility] for shortness of breath, pt  Received two duonebs in the field, pt has seen PCP this week with medication changes, pt has wheezes in bilateral upper lobes with a cough, pt has increased wheezing with exertion

## 2015-07-31 NOTE — Progress Notes (Signed)
The patient was noted to have tachycardia with a rate of 140 intermittently on arrival to the floor, not related to DuoNeb therapy, and hydralazine was never given. Advancing Coreg to 12.5 twice daily dose, getting d-dimer, initiate patient on heparin IV drip if d-dimer is elevated, order a V/Q scan for tomorrow morning to rule out pulmonary embolism.

## 2015-07-31 NOTE — H&P (Signed)
[facility] -  at [facility]   PATIENT NAME: Erin Macias    MR#:  [id number]  DATE OF BIRTH:  19-Jul-1923  DATE OF ADMISSION:  07/31/2015  PRIMARY CARE PHYSICIAN: Erin Macias   REQUESTING/REFERRING PHYSICIAN:   CHIEF COMPLAINT:   Chief Complaint  Patient presents with  . Shortness of Breath    HISTORY OF PRESENT ILLNESS: Erin Macias  is a 82 y.o. female with a known history of Sick sinus syndrome, hypertension, hyperlipidemia, and significant coronary artery disease, status post dual-chamber pacemaker placement, renal cell carcinoma, ongoing tobacco abuse, who presents to the hospital with complaints of weakness, coughing, shortness of breath, which is going on for the past few months, worsening over the past 3 weeks.  She admitted of feeling chilly past few days, wheezing and very short of breath. She is not able to produce any sputum. She felt presyncopal numerous times with cough. She can barely walk through the room with the feeling of throat closing on her . She admitted of lower extremity swelling, and apparently she has been treated by Erin Macias for congestive heart failure exacerbation with diuretics, although admitted of decreasing frequency of urinary output. On arrival to the hospital. She was given duo nebs and improved. Her kidney function was found to be slightly abnormal as compared to prior studies and hospice services were contacted for admission. Patient admits of ongoing smoking for the past 60 years/all life, according to her  PAST MEDICAL HISTORY:   Past Medical History  Diagnosis Date  . CHF (congestive heart failure) (HCC)   . Pacemaker   . COPD (chronic obstructive pulmonary disease) (HCC)     PAST SURGICAL HISTORY: Past Surgical History  Procedure Laterality Date  . Hip arthroplasty      SOCIAL HISTORY:  Social History  Substance Use Topics  . Smoking status: Current Every Day Smoker -- 0.50 packs/day    Types:  Cigarettes  . Smokeless tobacco: Not on file  . Alcohol Use: No    FAMILY HISTORY: No family history on file.  DRUG ALLERGIES:  Allergies  Allergen Reactions  . Codeine Nausea And Vomiting    Review of Systems  Constitutional: Positive for chills and malaise/fatigue. Negative for fever and weight loss.  HENT: Negative for congestion.   Eyes: Positive for blurred vision. Negative for double vision.  Respiratory: Positive for cough, shortness of breath and wheezing. Negative for sputum production.   Cardiovascular: Positive for palpitations, orthopnea and leg swelling. Negative for chest pain and PND.  Gastrointestinal: Positive for nausea, vomiting and diarrhea. Negative for abdominal pain, constipation, blood in stool and melena.  Genitourinary: Negative for dysuria, urgency, frequency and hematuria.  Musculoskeletal: Negative for falls.  Skin: Negative for rash.  Neurological: Positive for weakness. Negative for dizziness.  Psychiatric/Behavioral: Negative for depression and memory loss. The patient is not nervous/anxious.     MEDICATIONS AT HOME:  Prior to Admission medications   Medication Sig Start Date End Date Taking? Authorizing Provider  aspirin EC 325 MG tablet Take 325 mg by mouth daily.   Yes Historical Provider, Macias  Calcium Carbonate-Vit D-Min (CALCIUM 600+D PLUS MINERALS) 600-400 MG-UNIT TABS Take 1 tablet by mouth daily.   Yes Historical Provider, Macias  furosemide (LASIX) 20 MG tablet Take 20 mg by mouth daily. Dr told patient to take 40mg  daily for 3 days (starting 6/26) then take 20mg  daily after that 07/17/15 07/16/16 Yes Historical Provider, Macias  Glucosamine-Chondroitin 250-200 MG TABS Take  1 tablet by mouth daily.   Yes Historical Provider, Macias  lisinopril (PRINIVIL,ZESTRIL) 30 MG tablet Take 30 mg by mouth daily. 07/17/15 07/16/16 Yes Historical Provider, Macias  meloxicam (MOBIC) 7.5 MG tablet Take 7.5 mg by mouth daily.   Yes Historical Provider, Macias  metoprolol succinate  (TOPROL-XL) 50 MG 24 hr tablet Take 50 mg by mouth 2 (two) times daily.  06/03/16 Yes Historical Provider, Macias  Multiple Vitamin (MULTI-VITAMINS) TABS Take 1 tablet by mouth 2 (two) times daily.   Yes Historical Provider, Macias  predniSONE (DELTASONE) 10 MG tablet Take 10-20 mg by mouth See admin instructions. Take 20mg  daily for 5 days then 10mg  daily for 5 days 07/29/15  Yes Historical Provider, Macias      PHYSICAL EXAMINATION:   VITAL SIGNS: Blood pressure 133/68, pulse 64, temperature 96 F (35.6 C), temperature source Axillary, resp. rate 25, height 5' (1.524 m), weight 54.432 kg (120 lb), SpO2 97 %.  GENERAL:  82 y.o.-year-old patient lying in the bedIn mild-to-moderate respiratory distress, her face turns red whenever she tries to cough, paroxysmal cough is noted during evaluation.  EYES: Pupils equal, round, reactive to light and accommodation. No scleral icterus. Extraocular muscles intact.  HEENT: Head atraumatic, normocephalic. Oropharynx and nasopharynx clear. Patient has difficulty hearing. NECK:  Supple, no jugular venous distention. No thyroid enlargement, no tenderness.  LUNGS: Markedly diminished breath sounds bilaterally, diffuse expiratory wheezing, few rales,rhonchi , no crepitations. Intermittently usingaccessory muscles of respiration.  CARDIOVASCULAR: S1, S2 normal. No murmurs, rubs, or gallops.  ABDOMEN: Soft, nontender, nondistended. Bowel sounds present. No organomegaly or mass.  EXTREMITIES: 1+ lower extremity and pedal edema, no cyanosis, or clubbing. No calf tenderness bilaterally NEUROLOGIC: Cranial nerves II through XII are intact. Muscle strength 5/5 in all extremities. Sensation intact. Gait not checked.  PSYCHIATRIC: The patient is alert and oriented x 3.  SKIN: No obvious rash, lesion, or ulcer.   LABORATORY PANEL:   CBC  Recent Labs Lab 07/31/15 0932  WBC 9.4  HGB 15.6  HCT 46.7  PLT 175  MCV 90.4  MCH 30.1  MCHC 33.3  RDW 17.2*  LYMPHSABS 1.2  MONOABS  0.7  EOSABS 0.0  BASOSABS 0.1   ------------------------------------------------------------------------------------------------------------------  Chemistries   Recent Labs Lab 07/31/15 1201  NA 139  K 4.4  CL 101  CO2 28  GLUCOSE 97  BUN 54*  CREATININE 1.66*  CALCIUM 9.8  AST 55*  ALT 35  ALKPHOS 58  BILITOT 1.5*   ------------------------------------------------------------------------------------------------------------------  Cardiac Enzymes  Recent Labs Lab 07/31/15 0932  TROPONINI 0.04*   ------------------------------------------------------------------------------------------------------------------  RADIOLOGY: Dg Chest 2 View  07/31/2015  CLINICAL DATA:  Difficulty breathing, leg swelling for 1 week EXAM: CHEST  2 VIEW COMPARISON:  1/12/ 13 FINDINGS: Cardiomegaly again noted. Dual lead cardiac pacemaker is unchanged in position. No acute infiltrate or pulmonary edema. Mild thoracic spine osteopenia. IMPRESSION: No active disease.  Dual lead cardiac pacemaker in place. Electronically Signed   By: Natasha MeadLiviu  Pop M.D.   On: 07/31/2015 10:46    EKG: Orders placed or performed during the hospital encounter of 07/31/15  . ED EKG  . ED EKG  . EKG 12-Lead  . EKG 12-Lead   EKG revealed ventricular placed with them at 65 bpm, nonspecific ST-T changes  IMPRESSION AND PLAN:  Active Problems:   Dyspnea   Malignant essential hypertension   Acute renal insufficiency   Elevated troponin #1 COPD exacerbation, admitted patient medical floor, initiate her on DuoNeb nebs, Pulmicort, steroids  intravenously, antibiotic, follow clinically, wean off oxygen as tolerated #2. Acute bronchitis, initiate patient on levofloxacin, get sputum cultures if possible #3. Dyspnea, check O2 sats prior to discharge home to rule out home oxygen need #4. Acute on chronic renal insufficiency with creatinine level bumping up from 1.4 in June 2017 to 1.66 today, hold diuretics, follow  creatinine in the morning #5. Elevated troponin, likely demand ischemia, initiate Coreg at low-dose, provided the patient's heart rate tolerates it, continue aspirin, get echocardiogram follow cardiac enzymes 3, get cardiologist involved for further recommendations #6. Malignant essential hypertension, add hydralazine if needed versus continue ACE inhibitor if patient's creatinine remains stable #7 tobacco abuse. Counseling, discussed this patient for approximately 5-7 minutes, nicotine replacement therapy will be initiated, patient is agreeable   All the records are reviewed and case discussed with ED provider. Management plans discussed with the patient, family and they are in agreement.  CODE STATUS: Code Status History    This patient does not have a recorded code status. Please follow your organizational policy for patients in this situation.       TOTAL TIME TAKING CARE OF THIS PATIENT: 55 minutes.  Discussed with patient's family extensively  Bryam Taborda M.D on 07/31/2015 at 2:13 PM  Between 7am to 6pm - Pager - [phone] After 6pm go to [email or web address] - password [email or web address] [facility]  [facility]  Office  [phone]  CC: Primary care physician; BABAOFF, Erin Mesi, Macias

## 2015-07-31 NOTE — ED Provider Notes (Signed)
[facility] Emergency Department Provider Note        Time seen: ----------------------------------------- 9:26 AM on 07/31/2015 -----------------------------------------    I have reviewed the triage vital signs and the nursing notes.   HISTORY  Chief Complaint Shortness of Breath    HPI Erin Macias is a 82 y.o. female who presents to ER with multiple days of shortness of breath. Patient states she recently was placed on Lasix and prior to that time she had produce much urine. Says she been placed on Lasix she had about 24 hours where she urinated frequently, since then she has gone back to not producing much urine. She complains of persistent shortness of breath. She denies fevers, chest pain or other complaints. Medication adjustments were performed by her primary care doctor   No past medical history on file.  There are no active problems to display for this patient.   No past surgical history on file.  Allergies Review of patient's allergies indicates not on file.  Social History Social History  Substance Use Topics  . Smoking status: Not on file  . Smokeless tobacco: Not on file  . Alcohol Use: Not on file    Review of Systems Constitutional: Negative for fever. Cardiovascular: Negative for chest pain. Respiratory: Positive for shortness of breath Gastrointestinal: Negative for abdominal pain, vomiting and diarrhea. Genitourinary: Negative for dysuria. Musculoskeletal: Negative for back pain. Skin: Negative for rash. Neurological: Negative for headaches, focal weakness or numbness.  10-point ROS otherwise negative.  ____________________________________________   PHYSICAL EXAM:  VITAL SIGNS: ED Triage Vitals  Enc Vitals Group     BP --      Pulse --      Resp --      Temp --      Temp src --      SpO2 --      Weight --      Height --      Head Cir --      Peak Flow --      Pain Score --      Pain Loc --       Pain Edu? --      Excl. in GC? --     Constitutional: Alert and oriented. Well appearing and in Mild distress Eyes: Conjunctivae are normal. PERRL. Normal extraocular movements. ENT   Head: Normocephalic and atraumatic.   Nose: No congestion/rhinnorhea.   Mouth/Throat: Mucous membranes are moist.   Neck: No stridor. Cardiovascular: Normal rate, regular rhythm. No murmurs, rubs, or gallops. Respiratory: Mild tachypnea with rales bilaterally Gastrointestinal: Soft and nontender. Normal bowel sounds Musculoskeletal: Nontender with normal range of motion in all extremities. No lower extremity tenderness, bilateral lower extremity edema Neurologic:  Normal speech and language. No gross focal neurologic deficits are appreciated.  Skin:  Skin is warm, dry and intact. No rash noted. Psychiatric: Mood and affect are normal. Speech and behavior are normal.  ____________________________________________  EKG: Interpreted by me. Ventricular paced rhythm with a rate of 65 bpm, no other acute changes are identified  ____________________________________________  ED COURSE:  Pertinent labs & imaging results that were available during my care of the patient were reviewed by me and considered in my medical decision making (see chart for details). Patient presents to ER with dyspnea which is likely CHF related. We will check basic labs and likely admit for diuresis. ____________________________________________    LABS (pertinent positives/negatives)  Labs Reviewed  CBC WITH DIFFERENTIAL/PLATELET - Abnormal; Notable for  the following:    RDW 17.2 (*)    Neutro Abs 7.5 (*)    All other components within normal limits  TROPONIN I - Abnormal; Notable for the following:    Troponin I 0.04 (*)    All other components within normal limits  BRAIN NATRIURETIC PEPTIDE - Abnormal; Notable for the following:    B Natriuretic Peptide 2449.0 (*)    All other components within normal limits   COMPREHENSIVE METABOLIC PANEL - Abnormal; Notable for the following:    BUN 54 (*)    Creatinine, Ser 1.66 (*)    AST 55 (*)    Total Bilirubin 1.5 (*)    GFR calc non Af Amer 26 (*)    GFR calc Af Amer 30 (*)    All other components within normal limits    RADIOLOGY Images were viewed by me  Chest x-ray IMPRESSION: No active disease. Dual lead cardiac pacemaker in place. ____________________________________________  FINAL ASSESSMENT AND PLAN  Dyspnea, Congestive heart failure, acute renal failure  Plan: Patient with labs and imaging as dictated above. Patient is recently been treated for congestive heart failure and likely has been over diuresed. She has acute renal insufficiency, will need IV fluids gently. Troponin likely chronically elevated. She also needs occasional duo nebs. I'll recommend observation.   Emily FilbertWilliams, Larri Yehle E, MD   Note: This dictation was prepared with Dragon dictation. Any transcriptional errors that result from this process are unintentional   Emily FilbertJonathan E Sherena Machorro, MD 07/31/15 1326

## 2015-07-31 NOTE — Progress Notes (Signed)
Patient admitted to 2A today from home alone. Family has been at bedside. No complaints at this time. Oxygen saturation is stable on room air. Patient has ambulated to the bathroom with standby assist. Normally independent at home. Paced on tele. Vitals are stable. Troponin slightly elevated, Dr. Juliann Paresallwood has evaluated patient this evening. Will continue to monitor.

## 2015-07-31 NOTE — Progress Notes (Signed)
While watching patient's telemetry, noticed patient's heart rate periodically is going to the 120's-130's and sustaining for a few minutes, then goes back down to 70-80. CCMD has not notified primary RN. Dr. Juliann Paresallwood paged and no return phone call. Dr. Winona LegatoVaickute paged and MD is placing new orders for ddimer and to increase coreg dose. MD changed patient from metoprolol to coreg due to kidney function. Stated to keep nitro paste for now due to elevated troponin and BP.

## 2015-08-01 ENCOUNTER — Observation Stay: Payer: Medicare Other

## 2015-08-01 ENCOUNTER — Observation Stay
Admit: 2015-08-01 | Discharge: 2015-08-01 | Disposition: A | Discharge status: Home / Self Care | Payer: Medicare Other | Attending: Internal Medicine | Admitting: Internal Medicine

## 2015-08-01 LAB — ECHOCARDIOGRAM COMPLETE
AOPV: 0.64 m/s
AV peak Index: 1.37
AVAREAVTI: 2.01 cm2
AVPG: 4 mmHg
AVPKVEL: 103 cm/s
EERAT: 7.95
EWDT: 268 ms
FS: 33 % (ref 28–44)
HEIGHTINCHES: 59 in
IV/PV OW: 1.22
LA diam end sys: 35 mm
LA vol A4C: 30.6 ml
LADIAMINDEX: 2.38 cm/m2
LASIZE: 35 mm
LV E/e' medial: 7.95
LV TDI E'LATERAL: 5.55
LV e' LATERAL: 5.55 cm/s
LVEEAVG: 7.95
LVOT area: 3.14 cm2
LVOT diameter: 20 mm
LVOTPV: 66 cm/s
MV Dec: 268
MV pk A vel: 72.3 m/s
MVPKEVEL: 44.1 m/s
P 1/2 time: 491 ms
PW: 10.4 mm — AB (ref 0.6–1.1)
TDI e' medial: 4.9
WEIGHTICAEL: 1875.2 [oz_av]

## 2015-08-01 LAB — CBC
HEMATOCRIT: 39.7 % (ref 35.0–47.0)
Hemoglobin: 13.4 g/dL (ref 12.0–16.0)
MCH: 30.5 pg (ref 26.0–34.0)
MCHC: 33.7 g/dL (ref 32.0–36.0)
MCV: 90.6 fL (ref 80.0–100.0)
PLATELETS: 130 10*3/uL — AB (ref 150–440)
RBC: 4.38 MIL/uL (ref 3.80–5.20)
RDW: 16.5 % — AB (ref 11.5–14.5)
WBC: 4.4 10*3/uL (ref 3.6–11.0)

## 2015-08-01 LAB — BASIC METABOLIC PANEL
Anion gap: 8 (ref 5–15)
BUN: 53 mg/dL — AB (ref 6–20)
CHLORIDE: 102 mmol/L (ref 101–111)
CO2: 28 mmol/L (ref 22–32)
CREATININE: 1.6 mg/dL — AB (ref 0.44–1.00)
Calcium: 9.3 mg/dL (ref 8.9–10.3)
GFR calc Af Amer: 32 mL/min — ABNORMAL LOW (ref >60)
GFR calc non Af Amer: 27 mL/min — ABNORMAL LOW (ref >60)
Glucose, Bld: 122 mg/dL — ABNORMAL HIGH (ref 65–99)
POTASSIUM: 4.1 mmol/L (ref 3.5–5.1)
Sodium: 138 mmol/L (ref 135–145)

## 2015-08-01 MED ORDER — FUROSEMIDE 10 MG/ML IJ SOLN
20.0000 mg | Freq: Every day | INTRAMUSCULAR | Status: DC
  Administered 2015-08-01 – 2015-08-02 (×2): 20 mg via INTRAVENOUS
  Filled 2015-08-01 (×2): qty 2

## 2015-08-01 MED ORDER — METHYLPREDNISOLONE SODIUM SUCC 40 MG IJ SOLR
40.0000 mg | Freq: Two times a day (BID) | INTRAMUSCULAR | Status: DC
  Administered 2015-08-01 – 2015-08-02 (×3): 40 mg via INTRAVENOUS
  Filled 2015-08-01 (×3): qty 1

## 2015-08-01 MED ORDER — FUROSEMIDE 10 MG/ML IJ SOLN: 40.0000 mg | Freq: Every day | INTRAMUSCULAR | Status: DC

## 2015-08-01 MED ORDER — TECHNETIUM TC 99M DIETHYLENETRIAME-PENTAACETIC ACID
32.0200 | Freq: Once | INTRAVENOUS | Status: AC | PRN
  Administered 2015-08-01: 32.02 via INTRAVENOUS

## 2015-08-01 MED ORDER — LEVOFLOXACIN 500 MG PO TABS
250.0000 mg | ORAL_TABLET | ORAL | Status: DC
  Administered 2015-08-02: 250 mg via ORAL
  Filled 2015-08-01: qty 1

## 2015-08-01 MED ORDER — HEPARIN SODIUM (PORCINE) 5000 UNIT/ML IJ SOLN
5000.0000 [IU] | Freq: Two times a day (BID) | INTRAMUSCULAR | Status: DC
  Administered 2015-08-01 – 2015-08-02 (×3): 5000 [IU] via SUBCUTANEOUS
  Filled 2015-08-01 (×3): qty 1

## 2015-08-01 MED ORDER — TECHNETIUM TO 99M ALBUMIN AGGREGATED
4.2000 | Freq: Once | INTRAVENOUS | Status: AC | PRN
  Administered 2015-08-01: 4.2 via INTRAVENOUS

## 2015-08-01 NOTE — Evaluation (Signed)
Physical Therapy Evaluation Patient Details Name: Erin BaconBeverly J Theys MRN: [id number] DOB: 1924-01-25 Today's Date: 08/01/2015   History of Present Illness  Pt admitted for dyspnea.  Pt with history of dual chamber pacemaker, sinus sick syndrome, HTN, CAD, and COPD. Pt with negative PE testing.  Clinical Impression  Pt is a pleasant 82 year old female who was admitted for. Pt performs bed mobility with mod I and transfers/ambulation with cga without RW. This is baseline level. Pt does not require any further PT needs at this time. O2 sats WNL during all mobility while on room air. Pt will be dc in house and does not require follow up. RN aware. Will dc current orders.      Follow Up Recommendations No PT follow up    Equipment Recommendations       Recommendations for Other Services       Precautions / Restrictions Precautions Precautions: Fall Restrictions Weight Bearing Restrictions: No      Mobility  Bed Mobility Overal bed mobility: Modified Independent             General bed mobility comments: safe technique performed. Upright posture noted  Transfers Overall transfer level: Needs assistance Equipment used: None Transfers: Sit to/from Stand Sit to Stand: Min guard         General transfer comment: transfers performed with safe technique. No AD required  Ambulation/Gait Ambulation/Gait assistance: Min guard Ambulation Distance (Feet): 130 Feet Assistive device: None Gait Pattern/deviations: Step-through pattern     General Gait Details: ambulated using no AD with reciprocal gait pattern. Pt with slight SOB symptoms noted however O2 sats WNL.  Stairs            Wheelchair Mobility    Modified Rankin (Stroke Patients Only)       Balance Overall balance assessment: Independent                                           Pertinent Vitals/Pain Pain Assessment: No/denies pain    Home Living Family/patient expects to be  discharged to:: Private residence Living Arrangements: Alone Available Help at Discharge: Family Type of Home: House Home Access: Stairs to enter Entrance Stairs-Rails: None Entrance Stairs-Number of Steps: 1 Home Layout: Laundry or work area in basement Home Equipment: Environmental consultantWalker - 2 wheels;Cane - single point (belong to husband previously)      Prior Function Level of Independence: Independent               Hand Dominance        Extremity/Trunk Assessment   Upper Extremity Assessment: Overall WFL for tasks assessed           Lower Extremity Assessment: Overall WFL for tasks assessed         Communication   Communication: No difficulties  Cognition Arousal/Alertness: Awake/alert Behavior During Therapy: WFL for tasks assessed/performed Overall Cognitive Status: Within Functional Limits for tasks assessed                      General Comments      Exercises        Assessment/Plan    PT Assessment Patent does not need any further PT services  PT Diagnosis     PT Problem List    PT Treatment Interventions     PT Goals (Current goals can be found in the Care  Plan section) Acute Rehab PT Goals Patient Stated Goal: to go back home PT Goal Formulation: All assessment and education complete, DC therapy Time For Goal Achievement: 08/01/15 Potential to Achieve Goals: Good    Frequency     Barriers to discharge        Co-evaluation               End of Session   Activity Tolerance: Patient tolerated treatment well Patient left: in bed;with bed alarm set Nurse Communication: Mobility status    Functional Assessment Tool Used: clinical judgement Functional Limitation: Mobility: Walking and moving around Mobility: Walking and Moving Around Current Status ([id number](G8978): 0 percent impaired, limited or restricted Mobility: Walking and Moving Around Goal Status [phone](G8979): 0 percent impaired, limited or restricted Mobility: Walking and Moving  Around Discharge Status [phone](G8980): 0 percent impaired, limited or restricted    Time: [id number] PT Time Calculation (min) (ACUTE ONLY): 19 min   Charges:   PT Evaluation $PT Eval Low Complexity: 1 Procedure     PT G Codes:   PT G-Codes **NOT FOR INPATIENT CLASS** Functional Assessment Tool Used: clinical judgement Functional Limitation: Mobility: Walking and moving around Mobility: Walking and Moving Around Current Status ([id number](G8978): 0 percent impaired, limited or restricted Mobility: Walking and Moving Around Goal Status (Q6578(G8979): 0 percent impaired, limited or restricted Mobility: Walking and Moving Around Discharge Status (I6962(G8980): 0 percent impaired, limited or restricted    Erin Macias 08/01/2015, 5:12 PM  Erin PalauStephanie Eupha Macias, PT, DPT [phone]

## 2015-08-01 NOTE — Progress Notes (Signed)
Back from Nuclear medicine 

## 2015-08-01 NOTE — Plan of Care (Signed)
Problem: Safety: Goal: Ability to remain free from injury will improve Outcome: Progressing Fall precautions in place  Problem: Tissue Perfusion: Goal: Risk factors for ineffective tissue perfusion will decrease Outcome: Progressing SQ Lovenox     

## 2015-08-01 NOTE — Care Management Obs Status (Signed)
MEDICARE OBSERVATION STATUS NOTIFICATION   Patient Details  Name: Catheryn BaconBeverly J Corrales MRN: [id number] Date of Birth: [birth year]   Medicare Observation Status Notification Given:  Yes    Marily MemosLisa M Kadajah Kjos, RN 08/01/2015, 9:48 AM

## 2015-08-01 NOTE — Consult Note (Signed)
Reason for Consult: Shortness of breath congestive heart failure elevated troponin Referring Physician: Dr. Ether Griffins hospitalist, Dr Dimas Aguas primary  Erin Macias is an 82 y.o. female.  HPI: Patient presents with significant shortness of breath dyspnea cough history of smoking significant COPD did not in any significant improvement with inhalers came to the hospital for further evaluation patient has known sick sinus syndrome pacemaker in place again continued to smoke as known coronary disease patient presented with weakness cough and shortness of breath over the last several weeks Progressively worse saw Dr. Baldemar Lenis in the office heart failure was treated with diuretics patient had reduced urinary output had some improvement with inhalers renal function appeared to be abnormal slightly patient reportedly has hospitalist services contacted patient has a history of significant smoking and has not been able to quit has had some improvement with inhalers now was given steroids. Denies any significant chest pain just shortness of breath  Past Medical History  Diagnosis Date  . CHF (congestive heart failure) ([place])   . Pacemaker   . COPD (chronic obstructive pulmonary disease) [facility])     Past Surgical History  Procedure Laterality Date  . Hip arthroplasty      History reviewed. No pertinent family history.  Social History:  reports that she has been smoking Cigarettes.  She has been smoking about 0.50 packs per day. She does not have any smokeless tobacco history on file. She reports that she does not drink alcohol. Her drug history is not on file.  Allergies:  Allergies  Allergen Reactions  . Codeine Nausea And Vomiting    Medications: I have reviewed the patient's current medications.  Results for orders placed or performed during the hospital encounter of 07/31/15 (from the past 48 hour(s))  CBC with Differential     Status: Abnormal   Collection Time: 07/31/15  9:32 AM  Result  Value Ref Range   WBC 9.4 3.6 - 11.0 K/uL   RBC 5.17 3.80 - 5.20 MIL/uL   Hemoglobin 15.6 12.0 - 16.0 g/dL   HCT 46.7 35.0 - 47.0 %   MCV 90.4 80.0 - 100.0 fL   MCH 30.1 26.0 - 34.0 pg   MCHC 33.3 32.0 - 36.0 g/dL   RDW 17.2 (H) 11.5 - 14.5 %   Platelets 175 150 - 440 K/uL   Neutrophils Relative % 79 %   Neutro Abs 7.5 (H) 1.4 - 6.5 K/uL   Lymphocytes Relative 13 %   Lymphs Abs 1.2 1.0 - 3.6 K/uL   Monocytes Relative 7 %   Monocytes Absolute 0.7 0.2 - 0.9 K/uL   Eosinophils Relative 0 %   Eosinophils Absolute 0.0 0 - 0.7 K/uL   Basophils Relative 1 %   Basophils Absolute 0.1 0 - 0.1 K/uL  Troponin I     Status: Abnormal   Collection Time: 07/31/15  9:32 AM  Result Value Ref Range   Troponin I 0.04 (HH) <0.03 ng/mL    Comment: CRITICAL RESULT CALLED TO, READ BACK BY AND VERIFIED WITH: SUSAN NEAL AT 1122 07/31/15 DAS   Brain natriuretic peptide     Status: Abnormal   Collection Time: 07/31/15 10:39 AM  Result Value Ref Range   B Natriuretic Peptide 2449.0 (H) 0.0 - 100.0 pg/mL  Comprehensive metabolic panel     Status: Abnormal   Collection Time: 07/31/15 12:01 PM  Result Value Ref Range   Sodium 139 135 - 145 mmol/L   Potassium 4.4 3.5 - 5.1 mmol/L  Comment: HEMOLYSIS AT THIS LEVEL MAY AFFECT RESULT   Chloride 101 101 - 111 mmol/L   CO2 28 22 - 32 mmol/L   Glucose, Bld 97 65 - 99 mg/dL   BUN 54 (H) 6 - 20 mg/dL   Creatinine, Ser 1.66 (H) 0.44 - 1.00 mg/dL   Calcium 9.8 8.9 - 10.3 mg/dL   Total Protein 6.6 6.5 - 8.1 g/dL   Albumin 4.1 3.5 - 5.0 g/dL   AST 55 (H) 15 - 41 U/L    Comment: HEMOLYSIS AT THIS LEVEL MAY AFFECT RESULT   ALT 35 14 - 54 U/L   Alkaline Phosphatase 58 38 - 126 U/L   Total Bilirubin 1.5 (H) 0.3 - 1.2 mg/dL    Comment: HEMOLYSIS AT THIS LEVEL MAY AFFECT RESULT   GFR calc non Af Amer 26 (L) >60 mL/min   GFR calc Af Amer 30 (L) >60 mL/min    Comment: (NOTE) The eGFR has been calculated using the CKD EPI equation. This calculation has not been  validated in all clinical situations. eGFR's persistently <60 mL/min signify possible Chronic Kidney Disease.    Anion gap 10 5 - 15  Troponin I     Status: Abnormal   Collection Time: 07/31/15  4:21 PM  Result Value Ref Range   Troponin I 0.05 (HH) <0.03 ng/mL    Comment: CRITICAL VALUE NOTED. VALUE IS CONSISTENT WITH PREVIOUSLY REPORTED/CALLED VALUE  CALLED AT 1122 07/31/15 BY DAS.Marland KitchenMarland KitchenSDR   Troponin I     Status: Abnormal   Collection Time: 07/31/15  6:38 PM  Result Value Ref Range   Troponin I 0.04 (HH) <0.03 ng/mL    Comment: CRITICAL VALUE NOTED. VALUE IS CONSISTENT WITH PREVIOUSLY REPORTED/CALLED VALUE CALLED AT 1122 07/31/15 DAS.Marland KitchenMLZ   CBC     Status: Abnormal   Collection Time: 07/31/15  6:38 PM  Result Value Ref Range   WBC 6.9 3.6 - 11.0 K/uL   RBC 4.77 3.80 - 5.20 MIL/uL   Hemoglobin 14.4 12.0 - 16.0 g/dL   HCT 44.0 35.0 - 47.0 %   MCV 92.2 80.0 - 100.0 fL   MCH 30.1 26.0 - 34.0 pg   MCHC 32.7 32.0 - 36.0 g/dL   RDW 16.9 (H) 11.5 - 14.5 %   Platelets 142 (L) 150 - 440 K/uL  Creatinine, serum     Status: Abnormal   Collection Time: 07/31/15  6:38 PM  Result Value Ref Range   Creatinine, Ser 1.81 (H) 0.44 - 1.00 mg/dL   GFR calc non Af Amer 23 (L) >60 mL/min   GFR calc Af Amer 27 (L) >60 mL/min    Comment: (NOTE) The eGFR has been calculated using the CKD EPI equation. This calculation has not been validated in all clinical situations. eGFR's persistently <60 mL/min signify possible Chronic Kidney Disease.   Fibrin derivatives D-Dimer ([facility] only)     Status: Abnormal   Collection Time: 07/31/15  6:38 PM  Result Value Ref Range   Fibrin derivatives D-dimer ([facility]) 808 (H) 0 - 499    Comment: <> Exclusion of Venous Thromboembolism (VTE) - OUTPATIENTS ONLY        (Emergency Department or [place])             0-499 ng/ml (FEU)  : With a low to intermediate pretest  probability for VTE this test result                                         excludes the diagnosis of VTE.           > 499 ng/ml (FEU)  : VTE not excluded.  Additional work up                                   for VTE is required.   <>  Testing on Inpatients and Evaluation of Disseminated Intravascular        Coagulation ([facility])             Reference Range:   0-499 ng/ml (FEU)   Troponin I     Status: None   Collection Time: 07/31/15 10:53 PM  Result Value Ref Range   Troponin I <0.03 <0.03 ng/mL  Basic metabolic panel     Status: Abnormal   Collection Time: 08/01/15  3:56 AM  Result Value Ref Range   Sodium 138 135 - 145 mmol/L   Potassium 4.1 3.5 - 5.1 mmol/L   Chloride 102 101 - 111 mmol/L   CO2 28 22 - 32 mmol/L   Glucose, Bld 122 (H) 65 - 99 mg/dL   BUN 53 (H) 6 - 20 mg/dL   Creatinine, Ser 1.60 (H) 0.44 - 1.00 mg/dL   Calcium 9.3 8.9 - 10.3 mg/dL   GFR calc non Af Amer 27 (L) >60 mL/min   GFR calc Af Amer 32 (L) >60 mL/min    Comment: (NOTE) The eGFR has been calculated using the CKD EPI equation. This calculation has not been validated in all clinical situations. eGFR's persistently <60 mL/min signify possible Chronic Kidney Disease.    Anion gap 8 5 - 15  CBC     Status: Abnormal   Collection Time: 08/01/15  3:56 AM  Result Value Ref Range   WBC 4.4 3.6 - 11.0 K/uL   RBC 4.38 3.80 - 5.20 MIL/uL   Hemoglobin 13.4 12.0 - 16.0 g/dL   HCT 39.7 35.0 - 47.0 %   MCV 90.6 80.0 - 100.0 fL   MCH 30.5 26.0 - 34.0 pg   MCHC 33.7 32.0 - 36.0 g/dL   RDW 16.5 (H) 11.5 - 14.5 %   Platelets 130 (L) 150 - 440 K/uL    Dg Chest 1 View  08/01/2015  CLINICAL DATA:  CHF.  COPD. EXAM: CHEST 1 VIEW COMPARISON:  07/31/2015 FINDINGS: Left pacer remains in place, unchanged. Cardiomegaly. Small bilateral pleural effusions with bibasilar atelectasis. No overt edema. IMPRESSION: Cardiomegaly. Small effusions with bibasilar atelectasis. Electronically Signed   By: Rolm Baptise M.D.   On: 08/01/2015 10:27   Dg Chest 2 View  07/31/2015  CLINICAL DATA:   Difficulty breathing, leg swelling for 1 week EXAM: CHEST  2 VIEW COMPARISON:  1/12/ 13 FINDINGS: Cardiomegaly again noted. Dual lead cardiac pacemaker is unchanged in position. No acute infiltrate or pulmonary edema. Mild thoracic spine osteopenia. IMPRESSION: No active disease.  Dual lead cardiac pacemaker in place. Electronically Signed   By: Lahoma Crocker M.D.   On: 07/31/2015 10:46   Nm Pulmonary Perf And Vent  08/01/2015  CLINICAL DATA:  Shortness of breath and cough 2- 3 weeks.  Smoker EXAM: NUCLEAR MEDICINE VENTILATION - PERFUSION LUNG  SCAN TECHNIQUE: Ventilation images were obtained in multiple projections using inhaled aerosol Tc-68mDTPA. Perfusion images were obtained in multiple projections after intravenous injection of Tc-962mAA. RADIOPHARMACEUTICALS:  32.0 mCi Technetium-9959mPA aerosol inhalation and 4.2 mCi Technetium-82m71m IV COMPARISON:  Chest x-ray 08/01/2015 and chest CT 02/15/2011 FINDINGS: Ventilation: Examination demonstrates moderate bilateral heterogeneous uptake worse of the perihilar regions/central airways. Perfusion: Mild heterogeneous uptake bilaterally worse over the perihilar/central lungs. No definite focal peripheral which segmental/ subsegmental defect. Chest x-ray demonstrates no focal consolidation or significant effusion. Moderate cardiomegaly. IMPRESSION: Findings compatible with low probability for pulmonary embolism. Electronically Signed   By: DaniMarin Olp.   On: 08/01/2015 13:51    Review of Systems  Constitutional: Positive for malaise/fatigue.  HENT: Positive for congestion.   Eyes: Negative.   Respiratory: Positive for cough, shortness of breath and wheezing.        Bilateral diminished breath sounds with rhonchi throughout  Cardiovascular: Positive for leg swelling.  Gastrointestinal: Negative.   Genitourinary: Negative.   Musculoskeletal: Negative.   Skin: Negative.   Neurological: Positive for weakness.  Endo/Heme/Allergies: Negative.    Psychiatric/Behavioral: Negative.    Blood pressure 156/62, pulse 69, temperature 98 F (36.7 C), temperature source Oral, resp. rate 21, height 4' 11" (1.499 m), weight 53.162 kg (117 lb 3.2 oz), SpO2 93 %. Physical Exam  Assessment/Plan: Shortness of breath Elevated troponin Hypertension Acute renal insufficiency Congestive heart failure SSS syndrome Pacemaker in place COPD with shortness of breath Smoking . PLAN Agree with admit to telemetry continue EKGs and troponins Agree with supplemental oxygen therapy Continue inhalers for COPD his expiration Agree with steroid therapy Blood pressure control with Coreg lisinopril Continue Lasix therapy for possible heart failure Recommend NicoDerm a Chantix to help with tobacco abuse Continue broad-spectrum antibiotic therapy for possible bronchitis Consider pulmonary input and evaluation Elevated troponin probably demand ischemia recommend conservative medical therapy Have the patient follow-up with cardiology as an outpatient  Masato Pettie D. 08/01/2015, 3:25 PM

## 2015-08-01 NOTE — Progress Notes (Signed)
[employer] [facility] - [place] [facility]   PATIENT NAME: Erin Macias    MR#:  [id number]  DATE OF BIRTH:  04-08-1923  SUBJECTIVE:   Patient reports lower extremity edema has improved. Her shortness of breath is about the same. She denies chest pain or abdominal pain.  REVIEW OF SYSTEMS:    Review of Systems  Constitutional: Negative for fever, chills and malaise/fatigue.  HENT: Negative for ear discharge, ear pain, hearing loss, nosebleeds and sore throat.   Eyes: Negative for blurred vision and pain.  Respiratory: Positive for shortness of breath. Negative for cough, hemoptysis and wheezing.   Cardiovascular: Positive for leg swelling (better). Negative for chest pain and palpitations.  Gastrointestinal: Negative for nausea, vomiting, abdominal pain, diarrhea and blood in stool.  Genitourinary: Negative for dysuria.  Musculoskeletal: Negative for back pain.  Neurological: Negative for dizziness, tremors, speech change, focal weakness, seizures and headaches.  Endo/Heme/Allergies: Does not bruise/bleed easily.  Psychiatric/Behavioral: Negative for depression, suicidal ideas and hallucinations.    Tolerating Diet:Yes      DRUG ALLERGIES:   Allergies  Allergen Reactions  . Codeine Nausea And Vomiting    VITALS:  Blood pressure 176/66, pulse 78, temperature 97.8 F (36.6 C), temperature source Oral, resp. rate 17, height 4\' 11"  (1.499 m), weight 53.162 kg (117 lb 3.2 oz), SpO2 94 %.  PHYSICAL EXAMINATION:   Physical Exam  Constitutional: She is oriented to person, place, and time and well-developed, well-nourished, and in no distress. No distress.  HENT:  Head: Normocephalic.  Eyes: No scleral icterus.  Neck: Normal range of motion. Neck supple. No JVD present. No tracheal deviation present.  Cardiovascular: Normal rate and regular rhythm.  Exam reveals no gallop and no friction rub.   Murmur heard. Pulmonary/Chest: Effort normal. No respiratory  distress. She has no wheezes. She has rales. She exhibits no tenderness.  Abdominal: Soft. Bowel sounds are normal. She exhibits no distension and no mass. There is no tenderness. There is no rebound and no guarding.  Musculoskeletal: Normal range of motion. She exhibits no edema.  Neurological: She is alert and oriented to person, place, and time.  Skin: Skin is warm. No rash noted. No erythema.  Psychiatric: Affect and judgment normal.      LABORATORY PANEL:   CBC  Recent Labs Lab 08/01/15 0356  WBC 4.4  HGB 13.4  HCT 39.7  PLT 130*   ------------------------------------------------------------------------------------------------------------------  Chemistries   Recent Labs Lab 07/31/15 1201  08/01/15 0356  NA 139  --  138  K 4.4  --  4.1  CL 101  --  102  CO2 28  --  28  GLUCOSE 97  --  122*  BUN 54*  --  53*  CREATININE 1.66*  < > 1.60*  CALCIUM 9.8  --  9.3  AST 55*  --   --   ALT 35  --   --   ALKPHOS 58  --   --   BILITOT 1.5*  --   --   < > = values in this interval not displayed. ------------------------------------------------------------------------------------------------------------------  Cardiac Enzymes  Recent Labs Lab 07/31/15 1621 07/31/15 1838 07/31/15 2253  TROPONINI 0.05* 0.04* <0.03   ------------------------------------------------------------------------------------------------------------------  RADIOLOGY:  Dg Chest 1 View  08/01/2015  CLINICAL DATA:  CHF.  COPD. EXAM: CHEST 1 VIEW COMPARISON:  07/31/2015 FINDINGS: Left pacer remains in place, unchanged. Cardiomegaly. Small bilateral pleural effusions with bibasilar atelectasis. No overt edema. IMPRESSION: Cardiomegaly. Small effusions with bibasilar atelectasis. Electronically  Signed   By: Charlett NoseKevin  Dover M.D.   On: 08/01/2015 10:27   Dg Chest 2 View  07/31/2015  CLINICAL DATA:  Difficulty breathing, leg swelling for 1 week EXAM: CHEST  2 VIEW COMPARISON:  1/12/ 13 FINDINGS:  Cardiomegaly again noted. Dual lead cardiac pacemaker is unchanged in position. No acute infiltrate or pulmonary edema. Mild thoracic spine osteopenia. IMPRESSION: No active disease.  Dual lead cardiac pacemaker in place. Electronically Signed   By: Natasha MeadLiviu  Pop M.D.   On: 07/31/2015 10:46     ASSESSMENT AND PLAN:   82 year old female with diastolic heart failure and preserved ejection fraction and COPD who presented with dyspnea.  1. Acute COPD exacerbation with acute bronchitis: Continue duo nebs, IV steroids and inhalers. Wean off oxygen as tolerated.  2. Acute bronchitis without evidence of pneumonia on chest x-ray: Continue Levaquin  3. Dyspnea: Likely due to COPD exacerbation with mild diastolic heart failure. Echocardiogram showed preserved ejection fraction with mild to moderate MR VQ scan was ordered, results pending ( reads active in order hx). ISS ordered.   4. Acute on chronic mild diastolic heart failure: Patient has crackles on lung examination which could be from atelectasis or mild pulmonary edema. BNP was elevated on admission despite a normal chest x-ray. Start low-dose IV Lasix daily. Follow BMP. Continue Coreg and lisinopril.  5. AKI: baseline is 1.4 Follow BMP tomorrow on lasix, could be prerenal from CHF.      Management plans discussed with the patient and sje is in agreement.  CODE STATUS: full  TOTAL TIME TAKING CARE OF THIS PATIENT: 33 minutes.     POSSIBLE D/C 2-3 days, DEPENDING ON CLINICAL CONDITION.   Erin Macias M.D on 08/01/2015 at 11:41 AM  Between 7am to 6pm - Pager - [phone] After 6pm go to [email or web address] - password [email or web address] [email or web address]  [facility]  Office  [phone]  CC: Primary care physician; BABAOFF, Lavada MesiMARC E, MD  Note: This dictation was prepared with Dragon dictation along with smaller phrase technology. Any transcriptional errors that result from this process are unintentional.

## 2015-08-01 NOTE — Progress Notes (Signed)
PT Cancellation Note  Patient Details Name: Catheryn BaconBeverly J Rhyne MRN: [id number] DOB: 02/19/1923   Cancelled Treatment:    Reason Eval/Treat Not Completed: Other (comment). Consult received and chart reviewed. Pt with pending PE testing including pulm/perf vent imaging. Will hold therapy evaluation until cleared medically.   Zaryan Yakubov 08/01/2015, 8:14 AM  Elizabeth PalauStephanie Rama Mcclintock, PT, DPT [phone]

## 2015-08-01 NOTE — Progress Notes (Signed)
To Nuclear medicine via bed. 

## 2015-08-01 NOTE — Progress Notes (Signed)
To cardiovascular testing via bed 

## 2015-08-01 NOTE — Progress Notes (Signed)
Back from Cardiovascular testing

## 2015-08-01 NOTE — Care Management (Signed)
Met with patient at bedside for discharge planning. Patient states she ;lives alone. Uses no assistive devices. No home O2 or home health. States she ususally drives and have a large supportive family. Admitted with COPD exacerbation and bronchitis. "I am hoping to go home tomorrow". PCP is Dr. Rogelia Mire. No discharge needs anticipated. PT pending.

## 2015-08-01 NOTE — Progress Notes (Signed)
PHARMACIST - PHYSICIAN COMMUNICATION  CONCERNING:  Levofloxacin    RECOMMENDATION: Patient was prescribed levofloxacin for bronchitis.   It is now recommended that fluoroquinolones for the treatment of bronchitis be reserved for patients without alternative options.   The recommended dose for levofloxacin with CrCl <5820ml/min for this indication is 250mg  every 48 hours.     DESCRIPTION: The dose of Levoflxacin for this patient has been adjusted to levofloxacin 250mg  every 48 hours based on patient's calculated CrCl of 17.594ml/min.  Cher NakaiSheema Alyric Parkin, PharmD Clinical Pharmacist 08/01/2015 10:05 AM

## 2015-08-02 LAB — BASIC METABOLIC PANEL
Anion gap: 8 (ref 5–15)
BUN: 54 mg/dL — AB (ref 6–20)
CHLORIDE: 103 mmol/L (ref 101–111)
CO2: 27 mmol/L (ref 22–32)
CREATININE: 1.61 mg/dL — AB (ref 0.44–1.00)
Calcium: 9.4 mg/dL (ref 8.9–10.3)
GFR, EST AFRICAN AMERICAN: 31 mL/min — AB (ref >60)
GFR, EST NON AFRICAN AMERICAN: 27 mL/min — AB (ref >60)
Glucose, Bld: 136 mg/dL — ABNORMAL HIGH (ref 65–99)
Potassium: 3.7 mmol/L (ref 3.5–5.1)
SODIUM: 138 mmol/L (ref 135–145)

## 2015-08-02 MED ORDER — HYDROCHLOROTHIAZIDE 12.5 MG PO CAPS: 12.5000 mg | ORAL_CAPSULE | Freq: Every day | ORAL | Status: DC

## 2015-08-02 MED ORDER — TIOTROPIUM BROMIDE MONOHYDRATE 18 MCG IN CAPS: 18.0000 ug | ORAL_CAPSULE | Freq: Every day | RESPIRATORY_TRACT | Status: AC

## 2015-08-02 MED ORDER — FLUTICASONE-SALMETEROL 250-50 MCG/DOSE IN AEPB: 1.0000 | INHALATION_SPRAY | Freq: Two times a day (BID) | RESPIRATORY_TRACT | Status: AC

## 2015-08-02 MED ORDER — GUAIFENESIN-DM 100-10 MG/5ML PO SYRP: 5.0000 mL | ORAL_SOLUTION | ORAL | Status: DC | PRN

## 2015-08-02 MED ORDER — ALBUTEROL SULFATE HFA 108 (90 BASE) MCG/ACT IN AERS: 2.0000 | INHALATION_SPRAY | RESPIRATORY_TRACT | Status: AC | PRN

## 2015-08-02 MED ORDER — PREDNISONE 50 MG PO TABS: 50.0000 mg | ORAL_TABLET | Freq: Every day | ORAL | Status: DC

## 2015-08-02 NOTE — Discharge Summary (Signed)
[facility] -  at [facility]   PATIENT NAME: Erin Macias    MR#:  [id number]  DATE OF BIRTH:  1923-05-09  DATE OF ADMISSION:  07/31/2015 ADMITTING PHYSICIAN: Katharina Caper, MD  DATE OF DISCHARGE: 08/02/2015 12:07 PM  PRIMARY CARE PHYSICIAN: BABAOFF, MARC E, MD   ADMISSION DIAGNOSIS:  Dyspnea [R06.00] Acute renal insufficiency [N28.9] Congestive heart failure, unspecified congestive heart failure chronicity, unspecified congestive heart failure type (HCC) [I50.9]  DISCHARGE DIAGNOSIS:  Active Problems:   Dyspnea   Malignant essential hypertension   Acute renal insufficiency   Elevated troponin   SECONDARY DIAGNOSIS:   Past Medical History  Diagnosis Date  . CHF (congestive heart failure) (HCC)   . Pacemaker   . COPD (chronic obstructive pulmonary disease) (HCC)      ADMITTING HISTORY  HISTORY OF PRESENT ILLNESS: Erin Macias is a 82 y.o. female with a known history of Sick sinus syndrome, hypertension, hyperlipidemia, and significant coronary artery disease, status post dual-chamber pacemaker placement, renal cell carcinoma, ongoing tobacco abuse, who presents to the hospital with complaints of weakness, coughing, shortness of breath, which is going on for the past few months, worsening over the past 3 weeks. She admitted of feeling chilly past few days, wheezing and very short of breath. She is not able to produce any sputum. She felt presyncopal numerous times with cough. She can barely walk through the room with the feeling of throat closing on her . She admitted of lower extremity swelling, and apparently she has been treated by Dr. Carma Lair for congestive heart failure exacerbation with diuretics, although admitted of decreasing frequency of urinary output. On arrival to the hospital. She was given duo nebs and improved. Her kidney function was found to be slightly abnormal as compared to prior studies and hospice services were contacted for  admission. Patient admits of ongoing smoking for the past 60 years/all life, according to her  HOSPITAL COURSE:   * Acute COPD exacerbation Patient was treated with IV steroids and nebulizers in the hospital. Will change her to prednisone and albuterol inhaler. She is also being started on Advair and Spiriva at discharge.  * Hypertension Patient had her medications changed recently by PCP. She is on metoprolol and lisinopril. Recently her Norvasc and hydrochlorothiazide were discontinued. Blood pressure has been elevated in the hospital and patient is being started on 12.5 mg hydrochlorothiazide.  * Tobacco use Counseled to quit.  Chest x-ray showed no pneumonia. Echocardiogram showed normal ejection fraction. She was placed on Lasix for mild lower extremity swelling which has resolved. No CHF  I had a lengthy discussion with patient and family at bedside. Regarding course of COPD and the fact that it does worsen with time. I explained that Advair and Spiriva should prevent or reduce any COPD progression of flareup.  Stable for discharge home.   CONSULTS OBTAINED:  Treatment Team:  Alwyn Pea, MD  DRUG ALLERGIES:   Allergies  Allergen Reactions  . Codeine Nausea And Vomiting    DISCHARGE MEDICATIONS:   Discharge Medication List as of 08/02/2015 10:50 AM    START taking these medications   Details  albuterol (PROVENTIL HFA;VENTOLIN HFA) 108 (90 Base) MCG/ACT inhaler Inhale 2 puffs into the lungs every 4 (four) hours as needed for wheezing or shortness of breath., Starting 08/02/2015, Until Discontinued, Normal    Fluticasone-Salmeterol (ADVAIR DISKUS) 250-50 MCG/DOSE AEPB Inhale 1 puff into the lungs 2 (two) times daily., Starting 08/02/2015, Until Discontinued, Normal  guaiFENesin-dextromethorphan (ROBITUSSIN DM) 100-10 MG/5ML syrup Take 5 mLs by mouth every 4 (four) hours as needed for cough., Starting 08/02/2015, Until Discontinued, Normal    hydrochlorothiazide  (MICROZIDE) 12.5 MG capsule Take 1 capsule (12.5 mg total) by mouth daily., Starting 08/02/2015, Until Discontinued, Normal    tiotropium (SPIRIVA HANDIHALER) 18 MCG inhalation capsule Place 1 capsule (18 mcg total) into inhaler and inhale daily., Starting 08/02/2015, Until Discontinued, Normal      CONTINUE these medications which have CHANGED   Details  predniSONE (DELTASONE) 50 MG tablet Take 1 tablet (50 mg total) by mouth daily with breakfast., Starting 08/02/2015, Until Discontinued, Normal      CONTINUE these medications which have NOT CHANGED   Details  aspirin EC 325 MG tablet Take 325 mg by mouth daily., Until Discontinued, Historical Med    Calcium Carbonate-Vit D-Min (CALCIUM 600+D PLUS MINERALS) 600-400 MG-UNIT TABS Take 1 tablet by mouth daily., Until Discontinued, Historical Med    furosemide (LASIX) 20 MG tablet Take 20 mg by mouth daily. Dr told patient to take 40mg  daily for 3 days (starting 6/26) then take 20mg  daily after that, Starting 07/17/2015, Until Fri 07/16/16, Historical Med    Glucosamine-Chondroitin 250-200 MG TABS Take 1 tablet by mouth daily., Until Discontinued, Historical Med    lisinopril (PRINIVIL,ZESTRIL) 30 MG tablet Take 30 mg by mouth daily., Starting 07/17/2015, Until Fri 07/16/16, Historical Med    meloxicam (MOBIC) 7.5 MG tablet Take 7.5 mg by mouth daily., Until Discontinued, Historical Med    metoprolol succinate (TOPROL-XL) 50 MG 24 hr tablet Take 50 mg by mouth 2 (two) times daily., Until Thu 06/03/16, Historical Med    Multiple Vitamin (MULTI-VITAMINS) TABS Take 1 tablet by mouth 2 (two) times daily., Until Discontinued, Historical Med        Today   VITAL SIGNS:  Blood pressure 119/64, pulse 92, temperature 98.3 F (36.8 C), temperature source Oral, resp. rate 18, height 4\' 11"  (1.499 m), weight 53.162 kg (117 lb 3.2 oz), SpO2 95 %.  I/O:   Intake/Output Summary (Last 24 hours) at 08/02/15 1443 Last data filed at 08/02/15 0940  Gross per  24 hour  Intake    480 ml  Output    200 ml  Net    280 ml    PHYSICAL EXAMINATION:  Physical Exam  GENERAL:  82 y.o.-year-old patient lying in the bed with no acute distress.  LUNGS: Normal breath sounds bilaterally, no wheezing, rales,rhonchi or crepitation. No use of accessory muscles of respiration.  CARDIOVASCULAR: S1, S2 normal. No murmurs, rubs, or gallops.  ABDOMEN: Soft, non-tender, non-distended. Bowel sounds present. No organomegaly or mass.  NEUROLOGIC: Moves all 4 extremities. PSYCHIATRIC: The patient is alert and oriented x 3.  SKIN: No obvious rash, lesion, or ulcer.   DATA REVIEW:   CBC  Recent Labs Lab 08/01/15 0356  WBC 4.4  HGB 13.4  HCT 39.7  PLT 130*    Chemistries   Recent Labs Lab 07/31/15 1201  08/02/15 0500  NA 139  < > 138  K 4.4  < > 3.7  CL 101  < > 103  CO2 28  < > 27  GLUCOSE 97  < > 136*  BUN 54*  < > 54*  CREATININE 1.66*  < > 1.61*  CALCIUM 9.8  < > 9.4  AST 55*  --   --   ALT 35  --   --   ALKPHOS 58  --   --  BILITOT 1.5*  --   --   < > = values in this interval not displayed.  Cardiac Enzymes  Recent Labs Lab 07/31/15 2253  TROPONINI <0.03    Microbiology Results  Results for orders placed or performed in visit on 02/13/11  Influenza A&B Antigens [facility])     Status: None   Collection Time: 02/13/11  9:53 AM  Result Value Ref Range Status   Micro Text Report   Final       COMMENT                   NEGATIVE FOR INFLUENZA A (ANTIGEN ABSENT)   COMMENT                   NEGATIVE FOR INFLUENZA B (ANTIGEN ABSENT)   ANTIBIOTIC                                                      Clostridium Difficile by PCR     Status: None   Collection Time: 02/15/11  1:08 AM  Result Value Ref Range Status   Micro Text Report   Final       COMMENT                   NEGATIVE-CLOS.DIFFICILE TOXIN NOT DETECTED BY PCR   ANTIBIOTIC                                                        RADIOLOGY:  Dg Chest 1 View  08/01/2015   CLINICAL DATA:  CHF.  COPD. EXAM: CHEST 1 VIEW COMPARISON:  07/31/2015 FINDINGS: Left pacer remains in place, unchanged. Cardiomegaly. Small bilateral pleural effusions with bibasilar atelectasis. No overt edema. IMPRESSION: Cardiomegaly. Small effusions with bibasilar atelectasis. Electronically Signed   By: Charlett Nose M.D.   On: 08/01/2015 10:27   Nm Pulmonary Perf And Vent  08/01/2015  CLINICAL DATA:  Shortness of breath and cough 2- 3 weeks.  Smoker EXAM: NUCLEAR MEDICINE VENTILATION - PERFUSION LUNG SCAN TECHNIQUE: Ventilation images were obtained in multiple projections using inhaled aerosol Tc-75m DTPA. Perfusion images were obtained in multiple projections after intravenous injection of Tc-12m MAA. RADIOPHARMACEUTICALS:  32.0 mCi Technetium-65m DTPA aerosol inhalation and 4.2 mCi Technetium-64m MAA IV COMPARISON:  Chest x-ray 08/01/2015 and chest CT 02/15/2011 FINDINGS: Ventilation: Examination demonstrates moderate bilateral heterogeneous uptake worse of the perihilar regions/central airways. Perfusion: Mild heterogeneous uptake bilaterally worse over the perihilar/central lungs. No definite focal peripheral which segmental/ subsegmental defect. Chest x-ray demonstrates no focal consolidation or significant effusion. Moderate cardiomegaly. IMPRESSION: Findings compatible with low probability for pulmonary embolism. Electronically Signed   By: Elberta Fortis M.D.   On: 08/01/2015 13:51    Follow up with PCP in 1 week.  Management plans discussed with the patient, family and they are in agreement.  CODE STATUS:     Code Status Orders        Start     Ordered   07/31/15 1622  Full code   Continuous     07/31/15 1621    Code Status History    Date Active Date Inactive Code Status Order  ID Comments User Context   This patient has a current code status but no historical code status.      TOTAL TIME TAKING CARE OF THIS PATIENT ON DAY OF DISCHARGE: more than 30 minutes.   Milagros LollSudini,  Melrose Kearse R M.D on 08/02/2015 at 2:43 PM  Between 7am to 6pm - Pager - [phone]  After 6pm go to [email or web address] - password [email or web address] [facility]  [facility]  Office  [phone]  CC: Primary care physician; BABAOFF, Lavada MesiMARC E, MD  Note: This dictation was prepared with Dragon dictation along with smaller phrase technology. Any transcriptional errors that result from this process are unintentional.

## 2015-08-02 NOTE — Discharge Instructions (Signed)
Schedule post hospital follow-up with your PCP in 1week

## 2015-08-02 NOTE — Progress Notes (Signed)
Pt ambulated on room air, O2 sats remained above 90%. MD made aware. Orders to d/c pt to home. IV and tele removed and discharge instructions given to pt and daughter-in-law. Pt currently being escorted off unit via wheelchair by nursing.

## 2015-10-13 ENCOUNTER — Emergency Department: Payer: Medicare Other

## 2015-10-13 ENCOUNTER — Inpatient Hospital Stay
Admission: EM | Admit: 2015-10-13 | Discharge: 2015-10-18 | DRG: 291 | Disposition: A | Discharge status: Home Health | Payer: Medicare Other | Attending: Internal Medicine | Admitting: Internal Medicine

## 2015-10-13 ENCOUNTER — Encounter: Payer: Self-pay | Admitting: Emergency Medicine

## 2015-10-13 DIAGNOSIS — J90 Pleural effusion, not elsewhere classified: Secondary | ICD-10-CM | POA: Diagnosis present

## 2015-10-13 DIAGNOSIS — J441 Chronic obstructive pulmonary disease with (acute) exacerbation: Secondary | ICD-10-CM

## 2015-10-13 DIAGNOSIS — F17210 Nicotine dependence, cigarettes, uncomplicated: Secondary | ICD-10-CM | POA: Diagnosis present

## 2015-10-13 DIAGNOSIS — Z841 Family history of disorders of kidney and ureter: Secondary | ICD-10-CM

## 2015-10-13 DIAGNOSIS — R0602 Shortness of breath: Secondary | ICD-10-CM

## 2015-10-13 DIAGNOSIS — Z79899 Other long term (current) drug therapy: Secondary | ICD-10-CM

## 2015-10-13 DIAGNOSIS — E876 Hypokalemia: Secondary | ICD-10-CM | POA: Diagnosis present

## 2015-10-13 DIAGNOSIS — Z950 Presence of cardiac pacemaker: Secondary | ICD-10-CM | POA: Diagnosis not present

## 2015-10-13 DIAGNOSIS — J9601 Acute respiratory failure with hypoxia: Secondary | ICD-10-CM | POA: Diagnosis present

## 2015-10-13 DIAGNOSIS — I959 Hypotension, unspecified: Secondary | ICD-10-CM | POA: Diagnosis present

## 2015-10-13 DIAGNOSIS — D696 Thrombocytopenia, unspecified: Secondary | ICD-10-CM | POA: Diagnosis present

## 2015-10-13 DIAGNOSIS — I5033 Acute on chronic diastolic (congestive) heart failure: Secondary | ICD-10-CM | POA: Diagnosis present

## 2015-10-13 DIAGNOSIS — Z7982 Long term (current) use of aspirin: Secondary | ICD-10-CM

## 2015-10-13 DIAGNOSIS — Z8249 Family history of ischemic heart disease and other diseases of the circulatory system: Secondary | ICD-10-CM

## 2015-10-13 DIAGNOSIS — Z96649 Presence of unspecified artificial hip joint: Secondary | ICD-10-CM | POA: Diagnosis present

## 2015-10-13 DIAGNOSIS — E875 Hyperkalemia: Secondary | ICD-10-CM | POA: Diagnosis present

## 2015-10-13 DIAGNOSIS — Z823 Family history of stroke: Secondary | ICD-10-CM

## 2015-10-13 DIAGNOSIS — I482 Chronic atrial fibrillation: Secondary | ICD-10-CM | POA: Diagnosis present

## 2015-10-13 DIAGNOSIS — N183 Chronic kidney disease, stage 3 (moderate): Secondary | ICD-10-CM | POA: Diagnosis present

## 2015-10-13 DIAGNOSIS — I130 Hypertensive heart and chronic kidney disease with heart failure and stage 1 through stage 4 chronic kidney disease, or unspecified chronic kidney disease: Secondary | ICD-10-CM | POA: Diagnosis not present

## 2015-10-13 DIAGNOSIS — N179 Acute kidney failure, unspecified: Secondary | ICD-10-CM | POA: Diagnosis present

## 2015-10-13 DIAGNOSIS — I509 Heart failure, unspecified: Secondary | ICD-10-CM

## 2015-10-13 DIAGNOSIS — J449 Chronic obstructive pulmonary disease, unspecified: Secondary | ICD-10-CM | POA: Diagnosis present

## 2015-10-13 HISTORY — DX: Chronic kidney disease, unspecified: N18.9

## 2015-10-13 HISTORY — DX: Essential (primary) hypertension: I10

## 2015-10-13 HISTORY — DX: Unspecified atrial fibrillation: I48.91

## 2015-10-13 LAB — COMPREHENSIVE METABOLIC PANEL
ALBUMIN: 3.6 g/dL (ref 3.5–5.0)
ALT: 24 U/L (ref 14–54)
AST: 39 U/L (ref 15–41)
Alkaline Phosphatase: 63 U/L (ref 38–126)
Anion gap: 9 (ref 5–15)
BUN: 71 mg/dL — AB (ref 6–20)
CALCIUM: 9.9 mg/dL (ref 8.9–10.3)
CHLORIDE: 99 mmol/L — AB (ref 101–111)
CO2: 30 mmol/L (ref 22–32)
CREATININE: 2.25 mg/dL — AB (ref 0.44–1.00)
GFR calc Af Amer: 21 mL/min — ABNORMAL LOW (ref >60)
GFR, EST NON AFRICAN AMERICAN: 18 mL/min — AB (ref >60)
Glucose, Bld: 141 mg/dL — ABNORMAL HIGH (ref 65–99)
POTASSIUM: 5.8 mmol/L — AB (ref 3.5–5.1)
Sodium: 138 mmol/L (ref 135–145)
Total Bilirubin: 1.1 mg/dL (ref 0.3–1.2)
Total Protein: 6.4 g/dL — ABNORMAL LOW (ref 6.5–8.1)

## 2015-10-13 LAB — CBC WITH DIFFERENTIAL/PLATELET
BASOS ABS: 0.1 10*3/uL (ref 0–0.1)
BASOS PCT: 1 %
EOS PCT: 0 %
Eosinophils Absolute: 0 10*3/uL (ref 0–0.7)
HCT: 46 % (ref 35.0–47.0)
Hemoglobin: 15.3 g/dL (ref 12.0–16.0)
LYMPHS PCT: 6 %
Lymphs Abs: 0.5 10*3/uL — ABNORMAL LOW (ref 1.0–3.6)
MCH: 29.5 pg (ref 26.0–34.0)
MCHC: 33.3 g/dL (ref 32.0–36.0)
MCV: 88.7 fL (ref 80.0–100.0)
Monocytes Absolute: 0.6 10*3/uL (ref 0.2–0.9)
Monocytes Relative: 7 %
NEUTROS ABS: 7.6 10*3/uL — AB (ref 1.4–6.5)
Neutrophils Relative %: 86 %
PLATELETS: 120 10*3/uL — AB (ref 150–440)
RBC: 5.19 MIL/uL (ref 3.80–5.20)
RDW: 16.5 % — AB (ref 11.5–14.5)
WBC: 8.8 10*3/uL (ref 3.6–11.0)

## 2015-10-13 LAB — MAGNESIUM: Magnesium: 2.4 mg/dL (ref 1.7–2.4)

## 2015-10-13 LAB — URINALYSIS COMPLETE WITH MICROSCOPIC (ARMC ONLY)
BACTERIA UA: NONE SEEN
Bilirubin Urine: NEGATIVE
Glucose, UA: NEGATIVE mg/dL
Hgb urine dipstick: NEGATIVE
Ketones, ur: NEGATIVE mg/dL
Nitrite: POSITIVE — AB
PH: 6 (ref 5.0–8.0)
PROTEIN: NEGATIVE mg/dL
Specific Gravity, Urine: 1.008 (ref 1.005–1.030)

## 2015-10-13 LAB — BASIC METABOLIC PANEL
Anion gap: 10 (ref 5–15)
BUN: 71 mg/dL — ABNORMAL HIGH (ref 6–20)
CALCIUM: 9.7 mg/dL (ref 8.9–10.3)
CHLORIDE: 99 mmol/L — AB (ref 101–111)
CO2: 29 mmol/L (ref 22–32)
CREATININE: 2.24 mg/dL — AB (ref 0.44–1.00)
GFR calc Af Amer: 21 mL/min — ABNORMAL LOW (ref >60)
GFR calc non Af Amer: 18 mL/min — ABNORMAL LOW (ref >60)
Glucose, Bld: 178 mg/dL — ABNORMAL HIGH (ref 65–99)
Potassium: 4.6 mmol/L (ref 3.5–5.1)
SODIUM: 138 mmol/L (ref 135–145)

## 2015-10-13 LAB — TROPONIN I: Troponin I: 0.04 ng/mL (ref <0.03)

## 2015-10-13 LAB — BRAIN NATRIURETIC PEPTIDE: B Natriuretic Peptide: 2068 pg/mL — ABNORMAL HIGH (ref 0.0–100.0)

## 2015-10-13 MED ORDER — SODIUM CHLORIDE 0.9% FLUSH: 3.0000 mL | INTRAVENOUS | Status: DC | PRN

## 2015-10-13 MED ORDER — TIOTROPIUM BROMIDE MONOHYDRATE 18 MCG IN CAPS
18.0000 ug | ORAL_CAPSULE | Freq: Every day | RESPIRATORY_TRACT | Status: DC
  Administered 2015-10-13 – 2015-10-18 (×6): 18 ug via RESPIRATORY_TRACT
  Filled 2015-10-13 (×2): qty 5

## 2015-10-13 MED ORDER — SODIUM CHLORIDE 0.9 % IV SOLN: 250.0000 mL | INTRAVENOUS | Status: DC | PRN

## 2015-10-13 MED ORDER — METHYLPREDNISOLONE SODIUM SUCC 125 MG IJ SOLR
125.0000 mg | Freq: Once | INTRAMUSCULAR | Status: AC
  Administered 2015-10-13: 125 mg via INTRAVENOUS
  Filled 2015-10-13: qty 2

## 2015-10-13 MED ORDER — ONDANSETRON HCL 4 MG/2ML IJ SOLN: 4.0000 mg | Freq: Four times a day (QID) | INTRAMUSCULAR | Status: DC | PRN

## 2015-10-13 MED ORDER — ALBUTEROL SULFATE HFA 108 (90 BASE) MCG/ACT IN AERS: 2.0000 | INHALATION_SPRAY | RESPIRATORY_TRACT | Status: DC | PRN

## 2015-10-13 MED ORDER — ALBUTEROL SULFATE (2.5 MG/3ML) 0.083% IN NEBU: 2.5000 mg | INHALATION_SOLUTION | RESPIRATORY_TRACT | Status: DC | PRN

## 2015-10-13 MED ORDER — ACETAMINOPHEN 650 MG RE SUPP: 650.0000 mg | Freq: Four times a day (QID) | RECTAL | Status: DC | PRN

## 2015-10-13 MED ORDER — FUROSEMIDE 10 MG/ML IJ SOLN
40.0000 mg | Freq: Once | INTRAMUSCULAR | Status: AC
  Administered 2015-10-13: 40 mg via INTRAVENOUS
  Filled 2015-10-13: qty 4

## 2015-10-13 MED ORDER — SENNOSIDES-DOCUSATE SODIUM 8.6-50 MG PO TABS
1.0000 | ORAL_TABLET | Freq: Every evening | ORAL | Status: DC | PRN
  Filled 2015-10-13: qty 1

## 2015-10-13 MED ORDER — FUROSEMIDE 10 MG/ML IJ SOLN
40.0000 mg | Freq: Two times a day (BID) | INTRAMUSCULAR | Status: DC
  Administered 2015-10-13 – 2015-10-15 (×4): 40 mg via INTRAVENOUS
  Filled 2015-10-13 (×4): qty 4

## 2015-10-13 MED ORDER — BISACODYL 5 MG PO TBEC
5.0000 mg | DELAYED_RELEASE_TABLET | Freq: Every day | ORAL | Status: DC | PRN
  Administered 2015-10-16: 5 mg via ORAL
  Filled 2015-10-13: qty 1

## 2015-10-13 MED ORDER — ONDANSETRON HCL 4 MG PO TABS: 4.0000 mg | ORAL_TABLET | Freq: Four times a day (QID) | ORAL | Status: DC | PRN

## 2015-10-13 MED ORDER — MOMETASONE FURO-FORMOTEROL FUM 200-5 MCG/ACT IN AERO
2.0000 | INHALATION_SPRAY | Freq: Two times a day (BID) | RESPIRATORY_TRACT | Status: DC
  Administered 2015-10-13 – 2015-10-18 (×10): 2 via RESPIRATORY_TRACT
  Filled 2015-10-13: qty 8.8

## 2015-10-13 MED ORDER — SODIUM CHLORIDE 0.9% FLUSH
3.0000 mL | Freq: Two times a day (BID) | INTRAVENOUS | Status: DC
  Administered 2015-10-13 – 2015-10-18 (×10): 3 mL via INTRAVENOUS

## 2015-10-13 MED ORDER — IPRATROPIUM-ALBUTEROL 0.5-2.5 (3) MG/3ML IN SOLN
3.0000 mL | Freq: Once | RESPIRATORY_TRACT | Status: AC
  Administered 2015-10-13: 3 mL via RESPIRATORY_TRACT
  Filled 2015-10-13: qty 3

## 2015-10-13 MED ORDER — CARVEDILOL 12.5 MG PO TABS
12.5000 mg | ORAL_TABLET | Freq: Two times a day (BID) | ORAL | Status: DC
  Administered 2015-10-13 – 2015-10-16 (×7): 12.5 mg via ORAL
  Filled 2015-10-13 (×7): qty 1

## 2015-10-13 MED ORDER — POTASSIUM CHLORIDE CRYS ER 20 MEQ PO TBCR
20.0000 meq | EXTENDED_RELEASE_TABLET | Freq: Two times a day (BID) | ORAL | Status: DC
  Administered 2015-10-14 – 2015-10-16 (×5): 20 meq via ORAL
  Filled 2015-10-13 (×5): qty 1

## 2015-10-13 MED ORDER — ACETAMINOPHEN 325 MG PO TABS: 650.0000 mg | ORAL_TABLET | Freq: Four times a day (QID) | ORAL | Status: DC | PRN

## 2015-10-13 MED ORDER — ASPIRIN EC 81 MG PO TBEC
81.0000 mg | DELAYED_RELEASE_TABLET | Freq: Every day | ORAL | Status: DC
  Administered 2015-10-14 – 2015-10-18 (×5): 81 mg via ORAL
  Filled 2015-10-13 (×5): qty 1

## 2015-10-13 MED ORDER — SODIUM POLYSTYRENE SULFONATE 15 GM/60ML PO SUSP
30.0000 g | Freq: Once | ORAL | Status: AC
  Administered 2015-10-13: 30 g via ORAL
  Filled 2015-10-13: qty 120

## 2015-10-13 MED ORDER — HEPARIN SODIUM (PORCINE) 5000 UNIT/ML IJ SOLN
5000.0000 [IU] | Freq: Three times a day (TID) | INTRAMUSCULAR | Status: DC
  Administered 2015-10-13 – 2015-10-18 (×15): 5000 [IU] via SUBCUTANEOUS
  Filled 2015-10-13 (×15): qty 1

## 2015-10-13 NOTE — H&P (Addendum)
[facility] - [place] at [facility]   PATIENT NAME: Erin Macias    MR#:  [id number]  DATE OF BIRTH:  May 23, 1923  DATE OF ADMISSION:  10/13/2015  PRIMARY CARE PHYSICIAN: BABAOFF, Lavada Mesi, MD   REQUESTING/REFERRING PHYSICIAN: Charlynne Pander, MD  CHIEF COMPLAINT:   Chief Complaint  Patient presents with  . Congestive Heart Failure  . Shortness of Breath   Shortness of breath and leg swelling for 2 months,  worsening for one week. HISTORY OF PRESENT ILLNESS:  Erin Macias  is a 82 y.o. female with a known history of CHF, COPD, A. fib. The patient to present to the ED with shortness of breath on exertion and leg edema for 2 months, worsening for the past one week. She also has a increased body weight and generalized weakness. Her PCP increased Lasix to 40 mg by mouth daily a week ago and the thyroxine was aided if weight is above 125 pounds. She denies any chest pain or palpitation, but has orthopnea and leg swelling. Chest x-ray show congestive heart failure with mild pleural effusion. She was also noticed to have high potassium at 5.8 and acute renal failure.  PAST MEDICAL HISTORY:   Past Medical History:  Diagnosis Date  . Atrial fibrillation (HCC)   . CHF (congestive heart failure) (HCC)   . COPD (chronic obstructive pulmonary disease) (HCC)   . Pacemaker     PAST SURGICAL HISTORY:   Past Surgical History:  Procedure Laterality Date  . HIP ARTHROPLASTY    . PACEMAKER PLACEMENT      SOCIAL HISTORY:   Social History  Substance Use Topics  . Smoking status: Current Every Day Smoker    Packs/day: 0.50    Types: Cigarettes  . Smokeless tobacco: Never Used  . Alcohol use No    FAMILY HISTORY:   Family History  Problem Relation Age of Onset  . Hypertension Mother   . Cancer Mother   . Heart attack Mother   . Kidney failure Mother   . Cancer Father   . Stroke Brother     DRUG ALLERGIES:   Allergies  Allergen Reactions  . Codeine  Nausea And Vomiting    REVIEW OF SYSTEMS:   Review of Systems  Constitutional: Positive for chills and malaise/fatigue. Negative for fever.  HENT: Negative for sore throat.   Eyes: Negative for blurred vision and double vision.  Respiratory: Positive for cough, shortness of breath and wheezing. Negative for hemoptysis, sputum production and stridor.   Cardiovascular: Positive for orthopnea and leg swelling. Negative for chest pain and palpitations.  Gastrointestinal: Positive for nausea and vomiting. Negative for abdominal pain, blood in stool, diarrhea, heartburn and melena.  Genitourinary: Negative for dysuria and urgency.  Musculoskeletal: Negative for back pain.  Skin: Negative for itching and rash.  Neurological: Positive for weakness. Negative for dizziness, tingling, focal weakness, seizures, loss of consciousness and headaches.  Psychiatric/Behavioral: Negative for depression.    MEDICATIONS AT HOME:   Prior to Admission medications   Medication Sig Start Date End Date Taking? Authorizing Provider  albuterol (PROVENTIL HFA;VENTOLIN HFA) 108 (90 Base) MCG/ACT inhaler Inhale 2 puffs into the lungs every 4 (four) hours as needed for wheezing or shortness of breath. 08/02/15  Yes Srikar Sudini, MD  aspirin EC 81 MG tablet Take 81 mg by mouth daily.    Yes Historical Provider, MD  Calcium Carbonate-Vit D-Min (CALCIUM 600+D PLUS MINERALS) 600-400 MG-UNIT TABS Take 1 tablet by mouth daily.  Yes Historical Provider, MD  carvedilol (COREG) 12.5 MG tablet Take 12.5 mg by mouth 2 (two) times daily.   Yes Historical Provider, MD  Fluticasone-Salmeterol (ADVAIR DISKUS) 250-50 MCG/DOSE AEPB Inhale 1 puff into the lungs 2 (two) times daily. 08/02/15  Yes Srikar Sudini, MD  furosemide (LASIX) 20 MG tablet Take 20 mg by mouth daily. Dr told patient to take 40mg  daily for 3 days (starting 6/26) then take 20mg  daily after that 07/17/15 07/16/16 Yes Historical Provider, MD  Glucosamine-Chondroitin  250-200 MG TABS Take 1 tablet by mouth daily.   Yes Historical Provider, MD  lisinopril (PRINIVIL,ZESTRIL) 20 MG tablet Take 20 mg by mouth daily.  07/17/15 07/16/16 Yes Historical Provider, MD  Multiple Vitamin (MULTI-VITAMINS) TABS Take 1 tablet by mouth 2 (two) times daily.   Yes Historical Provider, MD  potassium chloride SA (K-DUR,KLOR-CON) 20 MEQ tablet Take 20 mEq by mouth 2 (two) times daily.   Yes Historical Provider, MD  tiotropium (SPIRIVA HANDIHALER) 18 MCG inhalation capsule Place 1 capsule (18 mcg total) into inhaler and inhale daily. 08/02/15  Yes Srikar Sudini, MD      VITAL SIGNS:  Blood pressure 114/82, pulse (!) 114, temperature 97.4 F (36.3 C), temperature source Oral, resp. rate (!) 21, height 4\' 9"  (1.448 m), weight 126 lb (57.2 kg), SpO2 99 %.  PHYSICAL EXAMINATION:  Physical Exam  GENERAL:  82 y.o.-year-old patient lying in the bed with no acute distress.  EYES: Pupils equal, round, reactive to light and accommodation. No scleral icterus. Extraocular muscles intact.  HEENT: Head atraumatic, normocephalic. Oropharynx and nasopharynx clear.  NECK:  Supple, no jugular venous distention. No thyroid enlargement, no tenderness.  LUNGS: Normal breath sounds bilaterally, no wheezing, has bibasilar rales. No use of accessory muscles of respiration.  CARDIOVASCULAR: S1, S2 normal. No murmurs, rubs, or gallops.  ABDOMEN: Soft, nontender, nondistended. Bowel sounds present. No organomegaly or mass.  EXTREMITIES: Bilateral lower extremity edema 2+, no cyanosis, or clubbing.  NEUROLOGIC: Cranial nerves II through XII are intact. Muscle strength 5/5 in upper extremities and 3/5 in lower extremities. Sensation intact. Gait not checked.  PSYCHIATRIC: The patient is alert and oriented x 3.  SKIN: No obvious rash, lesion, or ulcer.   LABORATORY PANEL:   CBC  Recent Labs Lab 10/13/15 1157  WBC 8.8  HGB 15.3  HCT 46.0  PLT 120*    ------------------------------------------------------------------------------------------------------------------  Chemistries   Recent Labs Lab 10/13/15 1157  NA 138  K 5.8*  CL 99*  CO2 30  GLUCOSE 141*  BUN 71*  CREATININE 2.25*  CALCIUM 9.9  AST 39  ALT 24  ALKPHOS 63  BILITOT 1.1   ------------------------------------------------------------------------------------------------------------------  Cardiac Enzymes  Recent Labs Lab 10/13/15 1157  TROPONINI 0.04*   ------------------------------------------------------------------------------------------------------------------  RADIOLOGY:  Dg Chest 2 View  Result Date: 10/13/2015 CLINICAL DATA:  Shortness of breath. EXAM: CHEST  2 VIEW COMPARISON:  Radiograph of August 01, 2015. FINDINGS: Stable cardiomediastinal silhouette. Left-sided pacemaker is unchanged in position. Atherosclerosis of thoracic aorta is noted. No pneumothorax is noted. Left lung is clear. Mildly increased right basilar opacity is noted concerning for edema or inflow with mild associated pleural effusion. Bony thorax is unremarkable. IMPRESSION: Aortic atherosclerosis. Mildly increased right basilar opacity concerning for edema or inflammation with mild associated pleural effusion. Electronically Signed   By: Lupita Raider, M.D.   On: 10/13/2015 13:07      IMPRESSION AND PLAN:   Acute on chronic diastolic CHF. Previous echo showing ejection fraction 60-65%.  Start Lasix 40 mg IV twice a day, CHF protocol, cardiology consult. Oxygen by nasal cannula, NEB when necessary.  Acute renal failure. Follow-up BMP while on Lasix. Hold lisinopril.  Hyperkalemia. Give Kayexalate by mouth, hold lisinopril and a follow-up BMP.  Thrombocytopenia. Looks like chronic, follow-up CBC. Tobacco abuse. Smoking cessation was counseled for 3 minutes.  All the records are reviewed and case discussed with ED provider. Management plans discussed with the patient,  her sister and they are in agreement.  CODE STATUS: DO NOT RESUSCITATE.  TOTAL TIME TAKING CARE OF THIS PATIENT: 61 minutes.    Shaune Pollackhen, Ashleen Demma M.D on 10/13/2015 at 2:37 PM  Between 7am to 6pm - Pager - [phone]  After 6pm go to [email or web address] - Social research officer, governmentpassword [email or web address]  [facility] [place] [facility]  Office  [phone]  CC: Primary care physician; BABAOFF, Lavada MesiMARC E, MD   Note: This dictation was prepared with Dragon dictation along with smaller phrase technology. Any transcriptional errors that result from this process are unintentional.

## 2015-10-13 NOTE — ED Triage Notes (Signed)
Pt to ED from home via EMS with shortness of breath, increased LE edema and increased DOE.  SpO2 96% on RA and was placed on n/c by EMS personnel.

## 2015-10-13 NOTE — ED Provider Notes (Signed)
[facility]-EMERGENCY DEPARTMENT Provider Note   CSN: [id number] Arrival date & time: 10/13/15  1112     History   Chief Complaint Chief Complaint  Patient presents with  . Congestive Heart Failure  . Shortness of Breath    HPI Erin Macias is a 82 y.o. female hx of CHF, COPD, sick sinus syndrome with pacemaker here with SOB, leg swelling. Progressively worsening shortness of breath over the last week. States that shortness of breath worse with exertion. Denies fevers but has subjective chills. No cough. She noticed that her legs are more swollen and her abdomen is more distended but has no vomiting. She saw PCP a week ago and lasix was increased to 40 mg daily and zaroxolyn was added if her weight is above 125 Ibs. This morning she noticed that her weight reached 125 ibs. She was admitted 2 months ago for CHF/COPD exacerbation.   The history is provided by the patient and the EMS personnel.    Past Medical History:  Diagnosis Date  . Atrial fibrillation (HCC)   . CHF (congestive heart failure) (HCC)   . COPD (chronic obstructive pulmonary disease) (HCC)   . Pacemaker     Patient Active Problem List   Diagnosis Date Noted  . Dyspnea 07/31/2015  . Malignant essential hypertension 07/31/2015  . Acute renal insufficiency 07/31/2015  . Elevated troponin 07/31/2015    Past Surgical History:  Procedure Laterality Date  . HIP ARTHROPLASTY    . PACEMAKER PLACEMENT      OB History    No data available       Home Medications    Prior to Admission medications   Medication Sig Start Date End Date Taking? Authorizing Provider  albuterol (PROVENTIL HFA;VENTOLIN HFA) 108 (90 Base) MCG/ACT inhaler Inhale 2 puffs into the lungs every 4 (four) hours as needed for wheezing or shortness of breath. 08/02/15   Milagros Loll, MD  aspirin EC 325 MG tablet Take 325 mg by mouth daily.    Historical Provider, MD  Calcium Carbonate-Vit D-Min (CALCIUM 600+D PLUS MINERALS) 600-400 MG-UNIT TABS  Take 1 tablet by mouth daily.    Historical Provider, MD  Fluticasone-Salmeterol (ADVAIR DISKUS) 250-50 MCG/DOSE AEPB Inhale 1 puff into the lungs 2 (two) times daily. 08/02/15   Srikar Sudini, MD  furosemide (LASIX) 20 MG tablet Take 20 mg by mouth daily. Dr told patient to take 40mg  daily for 3 days (starting 6/26) then take 20mg  daily after that 07/17/15 07/16/16  Historical Provider, MD  Glucosamine-Chondroitin 250-200 MG TABS Take 1 tablet by mouth daily.    Historical Provider, MD  guaiFENesin-dextromethorphan (ROBITUSSIN DM) 100-10 MG/5ML syrup Take 5 mLs by mouth every 4 (four) hours as needed for cough. 08/02/15   Srikar Sudini, MD  hydrochlorothiazide (MICROZIDE) 12.5 MG capsule Take 1 capsule (12.5 mg total) by mouth daily. 08/02/15   Srikar Sudini, MD  lisinopril (PRINIVIL,ZESTRIL) 30 MG tablet Take 30 mg by mouth daily. 07/17/15 07/16/16  Historical Provider, MD  meloxicam (MOBIC) 7.5 MG tablet Take 7.5 mg by mouth daily.    Historical Provider, MD  metoprolol succinate (TOPROL-XL) 50 MG 24 hr tablet Take 50 mg by mouth 2 (two) times daily.  06/03/16  Historical Provider, MD  Multiple Vitamin (MULTI-VITAMINS) TABS Take 1 tablet by mouth 2 (two) times daily.    Historical Provider, MD  predniSONE (DELTASONE) 50 MG tablet Take 1 tablet (50 mg total) by mouth daily with breakfast. 08/02/15   Milagros Loll, MD  tiotropium (SPIRIVA HANDIHALER)  18 MCG inhalation capsule Place 1 capsule (18 mcg total) into inhaler and inhale daily. 08/02/15   Milagros Loll, MD    Family History No family history on file.  Social History Social History  Substance Use Topics  . Smoking status: Current Every Day Smoker    Packs/day: 0.50    Types: Cigarettes  . Smokeless tobacco: Never Used  . Alcohol use No     Allergies   Codeine   Review of Systems Review of Systems  Respiratory: Positive for shortness of breath.   All other systems reviewed and are negative.    Physical Exam Updated Vital Signs BP  116/84 (BP Location: Right Arm)   Pulse (!) 114   Temp 97.4 F (36.3 C) (Oral)   Resp 18   Ht 4\' 9"  (1.448 m)   Wt 126 lb (57.2 kg)   SpO2 100%   BMI 27.27 kg/m   Physical Exam  Constitutional: She is oriented to person, place, and time.  Chronically ill, slightly tachypneic   HENT:  Head: Normocephalic.  Eyes: EOM are normal. Pupils are equal, round, and reactive to light.  Neck: Normal range of motion. Neck supple.  Cardiovascular: Normal rate, regular rhythm and normal heart sounds.   Pulmonary/Chest:  + crackles bilateral bases. Slightly tachypneic. Minimal wheezing upper lobes   Abdominal: Soft. Bowel sounds are normal.  ? Subcutaneous edema, no obvious fluid wave   Musculoskeletal:  2+ edema bilateral legs, no calf tenderness   Neurological: She is alert and oriented to person, place, and time.  Skin: Skin is warm.  Psychiatric: She has a normal mood and affect.  Nursing note and vitals reviewed.    ED Treatments / Results  Labs (all labs ordered are listed, but only abnormal results are displayed) Labs Reviewed  CBC WITH DIFFERENTIAL/PLATELET - Abnormal; Notable for the following:       Result Value   RDW 16.5 (*)    Platelets 120 (*)    Neutro Abs 7.6 (*)    Lymphs Abs 0.5 (*)    All other components within normal limits  COMPREHENSIVE METABOLIC PANEL - Abnormal; Notable for the following:    Potassium 5.8 (*)    Chloride 99 (*)    Glucose, Bld 141 (*)    BUN 71 (*)    Creatinine, Ser 2.25 (*)    Total Protein 6.4 (*)    GFR calc non Af Amer 18 (*)    GFR calc Af Amer 21 (*)    All other components within normal limits  TROPONIN I - Abnormal; Notable for the following:    Troponin I 0.04 (*)    All other components within normal limits  BRAIN NATRIURETIC PEPTIDE - Abnormal; Notable for the following:    B Natriuretic Peptide 2,068.0 (*)    All other components within normal limits  URINALYSIS COMPLETEWITH MICROSCOPIC [facility] ONLY)    EKG  EKG  Interpretation None      ED ECG REPORT I, Richardean Canal, the attending physician, personally viewed and interpreted this ECG.   Date: 10/13/2015  EKG Time: 11:45 am  Rate: 114  Rhythm: sinus tachycardia vs junctional   Axis: normal  Intervals:nonspecific intraventricular conduction delay  ST&T Change: nonspecific    Radiology Dg Chest 2 View  Result Date: 10/13/2015 CLINICAL DATA:  Shortness of breath. EXAM: CHEST  2 VIEW COMPARISON:  Radiograph of August 01, 2015. FINDINGS: Stable cardiomediastinal silhouette. Left-sided pacemaker is unchanged in position. Atherosclerosis of thoracic aorta  is noted. No pneumothorax is noted. Left lung is clear. Mildly increased right basilar opacity is noted concerning for edema or inflow with mild associated pleural effusion. Bony thorax is unremarkable. IMPRESSION: Aortic atherosclerosis. Mildly increased right basilar opacity concerning for edema or inflammation with mild associated pleural effusion. Electronically Signed   By: Lupita RaiderJames  Green Jr, M.D.   On: 10/13/2015 13:07    Procedures Procedures (including critical care time)  Medications Ordered in ED Medications  furosemide (LASIX) injection 40 mg (not administered)  ipratropium-albuterol (DUONEB) 0.5-2.5 (3) MG/3ML nebulizer solution 3 mL (3 mLs Nebulization Given 10/13/15 1158)  methylPREDNISolone sodium succinate (SOLU-MEDROL) 125 mg/2 mL injection 125 mg (125 mg Intravenous Given 10/13/15 1210)     Initial Impression / Assessment and Plan / ED Course  I have reviewed the triage vital signs and the nursing notes.  Pertinent labs & imaging results that were available during my care of the patient were reviewed by me and considered in my medical decision making (see chart for details).  Clinical Course    Erin Macias is a 82 y.o. female here with SOB. Likely COPD vs CHF exacerbation. Appears volume overloaded. Will check labs, BNP, CXR. Will also give neb, steroids and likely lasix if  BMP stable.   1:15 PM Labs showed K 5.8, and her K has been increased recently. Trop 0.04 likely demand ischemia. BNP 2000. CXR showed pleural effusion. Likely CHF exacerbation with acute on chronic renal failure. Ordered lasix 40 mg IV. Will need admission to monitor renal function.   Final Clinical Impressions(s) / ED Diagnoses   Final diagnoses:  None    New Prescriptions New Prescriptions   No medications on file     Charlynne Panderavid Hsienta Iram Astorino, MD 10/13/15 1316

## 2015-10-13 NOTE — Plan of Care (Signed)
Problem: Safety: Goal: Ability to remain free from injury will improve Outcome: Progressing Fall precautions in place  Problem: Tissue Perfusion: Goal: Risk factors for ineffective tissue perfusion will decrease Outcome: Progressing SQ heparin   

## 2015-10-13 NOTE — Progress Notes (Signed)
82 yo wf admitted to room 242 from ED with CHF exacerbation.  A&O x3, gait unsteady.  No distress on 2LO2 per [place].  Cardiac monitor placed on pt and verified.  Denies chest pain.  Lungs with fine crackles bil.   Skin intact, checked with Maddie, RN.  2+ pitting edema lower legs bil. SL lt fa flushes well.  Oriented to room and surroundings, POC reviewed with pt and sister.  Denies need. CB in reach, SR up x 2, bed alarm on.

## 2015-10-14 LAB — CBC
HCT: 42.6 % (ref 35.0–47.0)
Hemoglobin: 14.1 g/dL (ref 12.0–16.0)
MCH: 29.6 pg (ref 26.0–34.0)
MCHC: 33.2 g/dL (ref 32.0–36.0)
MCV: 89.1 fL (ref 80.0–100.0)
PLATELETS: 108 10*3/uL — AB (ref 150–440)
RBC: 4.79 MIL/uL (ref 3.80–5.20)
RDW: 16.5 % — ABNORMAL HIGH (ref 11.5–14.5)
WBC: 4.3 10*3/uL (ref 3.6–11.0)

## 2015-10-14 LAB — BASIC METABOLIC PANEL
Anion gap: 12 (ref 5–15)
BUN: 73 mg/dL — ABNORMAL HIGH (ref 6–20)
CALCIUM: 9.5 mg/dL (ref 8.9–10.3)
CO2: 31 mmol/L (ref 22–32)
CREATININE: 2.23 mg/dL — AB (ref 0.44–1.00)
Chloride: 97 mmol/L — ABNORMAL LOW (ref 101–111)
GFR calc Af Amer: 21 mL/min — ABNORMAL LOW (ref >60)
GFR calc non Af Amer: 18 mL/min — ABNORMAL LOW (ref >60)
GLUCOSE: 152 mg/dL — AB (ref 65–99)
Potassium: 3.8 mmol/L (ref 3.5–5.1)
Sodium: 140 mmol/L (ref 135–145)

## 2015-10-14 MED ORDER — ORAL CARE MOUTH RINSE
15.0000 mL | Freq: Two times a day (BID) | OROMUCOSAL | Status: DC
  Administered 2015-10-14 – 2015-10-18 (×7): 15 mL via OROMUCOSAL

## 2015-10-14 NOTE — Consult Note (Signed)
Reason for Consult: Shortness of breath congestive heart failure Referring Physician: Dr. Baldemar Lenis primary, Dr. Bridgett Larsson hospitalist Cardiologist Dr. Dannielle Karvonen Erin Macias is an 82 y.o. female.  HPI: Patient presents with history of atrial fibrillation and congestive heart failure or chronic renal insufficiency COPD hypertension known permanent pacemaker complains of significant fluid retention and leg edema. Patient's had progressive shortness of breath she's had significant weight gain she's taken oral Lasix therapy without significant improvement. Patient had chest x-ray performed which suggested heart failure with mild pleural effusion she also was found to have elevated potassium. Patient denies any significant chest pain. Patient here for evaluation of heart failure and shortness of breath  Past Medical History:  Diagnosis Date  . Atrial fibrillation ([place])   . CHF (congestive heart failure) ([employer])   . Chronic kidney disease   . COPD (chronic obstructive pulmonary disease) ([place])   . Hypertension   . Pacemaker     Past Surgical History:  Procedure Laterality Date  . HIP ARTHROPLASTY    . PACEMAKER PLACEMENT      Family History  Problem Relation Age of Onset  . Hypertension Mother   . Cancer Mother   . Heart attack Mother   . Kidney failure Mother   . Cancer Father   . Stroke Brother     Social History:  reports that she has been smoking Cigarettes.  She has been smoking about 0.50 packs per day. She has never used smokeless tobacco. She reports that she does not drink alcohol. Her drug history is not on file.  Allergies:  Allergies  Allergen Reactions  . Codeine Nausea And Vomiting    Medications: I have reviewed the patient's current medications.  Results for orders placed or performed during the hospital encounter of 10/13/15 (from the past 48 hour(s))  CBC with Differential/Platelet     Status: Abnormal   Collection Time: 10/13/15 11:57 AM  Result Value Ref Range    WBC 8.8 3.6 - 11.0 K/uL   RBC 5.19 3.80 - 5.20 MIL/uL   Hemoglobin 15.3 12.0 - 16.0 g/dL   HCT 46.0 35.0 - 47.0 %   MCV 88.7 80.0 - 100.0 fL   MCH 29.5 26.0 - 34.0 pg   MCHC 33.3 32.0 - 36.0 g/dL   RDW 16.5 (H) 11.5 - 14.5 %   Platelets 120 (L) 150 - 440 K/uL   Neutrophils Relative % 86 %   Neutro Abs 7.6 (H) 1.4 - 6.5 K/uL   Lymphocytes Relative 6 %   Lymphs Abs 0.5 (L) 1.0 - 3.6 K/uL   Monocytes Relative 7 %   Monocytes Absolute 0.6 0.2 - 0.9 K/uL   Eosinophils Relative 0 %   Eosinophils Absolute 0.0 0 - 0.7 K/uL   Basophils Relative 1 %   Basophils Absolute 0.1 0 - 0.1 K/uL  Comprehensive metabolic panel     Status: Abnormal   Collection Time: 10/13/15 11:57 AM  Result Value Ref Range   Sodium 138 135 - 145 mmol/L   Potassium 5.8 (H) 3.5 - 5.1 mmol/L   Chloride 99 (L) 101 - 111 mmol/L   CO2 30 22 - 32 mmol/L   Glucose, Bld 141 (H) 65 - 99 mg/dL   BUN 71 (H) 6 - 20 mg/dL   Creatinine, Ser 2.25 (H) 0.44 - 1.00 mg/dL   Calcium 9.9 8.9 - 10.3 mg/dL   Total Protein 6.4 (L) 6.5 - 8.1 g/dL   Albumin 3.6 3.5 - 5.0 g/dL   AST  39 15 - 41 U/L   ALT 24 14 - 54 U/L   Alkaline Phosphatase 63 38 - 126 U/L   Total Bilirubin 1.1 0.3 - 1.2 mg/dL   GFR calc non Af Amer 18 (L) >60 mL/min   GFR calc Af Amer 21 (L) >60 mL/min    Comment: (NOTE) The eGFR has been calculated using the CKD EPI equation. This calculation has not been validated in all clinical situations. eGFR's persistently <60 mL/min signify possible Chronic Kidney Disease.    Anion gap 9 5 - 15  Troponin I     Status: Abnormal   Collection Time: 10/13/15 11:57 AM  Result Value Ref Range   Troponin I 0.04 (HH) <0.03 ng/mL    Comment: CRITICAL RESULT CALLED TO, READ BACK BY AND VERIFIED WITH TRICIA SURLES AT 1241 10/13/15 DAS   Brain natriuretic peptide     Status: Abnormal   Collection Time: 10/13/15 11:57 AM  Result Value Ref Range   B Natriuretic Peptide 2,068.0 (H) 0.0 - 100.0 pg/mL  Magnesium     Status: None    Collection Time: 10/13/15 11:57 AM  Result Value Ref Range   Magnesium 2.4 1.7 - 2.4 mg/dL  Urinalysis complete, with microscopic ([facility] only)     Status: Abnormal   Collection Time: 10/13/15  1:28 PM  Result Value Ref Range   Color, Urine YELLOW (A) YELLOW   APPearance CLEAR (A) CLEAR   Glucose, UA NEGATIVE NEGATIVE mg/dL   Bilirubin Urine NEGATIVE NEGATIVE   Ketones, ur NEGATIVE NEGATIVE mg/dL   Specific Gravity, Urine 1.008 1.005 - 1.030   Hgb urine dipstick NEGATIVE NEGATIVE   pH 6.0 5.0 - 8.0   Protein, ur NEGATIVE NEGATIVE mg/dL   Nitrite POSITIVE (A) NEGATIVE   Leukocytes, UA 1+ (A) NEGATIVE   RBC / HPF 0-5 0 - 5 RBC/hpf   WBC, UA 6-30 0 - 5 WBC/hpf   Bacteria, UA NONE SEEN NONE SEEN   Squamous Epithelial / LPF 0-5 (A) NONE SEEN  Basic metabolic panel     Status: Abnormal   Collection Time: 10/13/15  7:57 PM  Result Value Ref Range   Sodium 138 135 - 145 mmol/L   Potassium 4.6 3.5 - 5.1 mmol/L   Chloride 99 (L) 101 - 111 mmol/L   CO2 29 22 - 32 mmol/L   Glucose, Bld 178 (H) 65 - 99 mg/dL   BUN 71 (H) 6 - 20 mg/dL   Creatinine, Ser 2.24 (H) 0.44 - 1.00 mg/dL   Calcium 9.7 8.9 - 10.3 mg/dL   GFR calc non Af Amer 18 (L) >60 mL/min   GFR calc Af Amer 21 (L) >60 mL/min    Comment: (NOTE) The eGFR has been calculated using the CKD EPI equation. This calculation has not been validated in all clinical situations. eGFR's persistently <60 mL/min signify possible Chronic Kidney Disease.    Anion gap 10 5 - 15  Basic metabolic panel     Status: Abnormal   Collection Time: 10/14/15  4:07 AM  Result Value Ref Range   Sodium 140 135 - 145 mmol/L   Potassium 3.8 3.5 - 5.1 mmol/L   Chloride 97 (L) 101 - 111 mmol/L   CO2 31 22 - 32 mmol/L   Glucose, Bld 152 (H) 65 - 99 mg/dL   BUN 73 (H) 6 - 20 mg/dL   Creatinine, Ser 2.23 (H) 0.44 - 1.00 mg/dL   Calcium 9.5 8.9 - 10.3 mg/dL   GFR calc  non Af Amer 18 (L) >60 mL/min   GFR calc Af Amer 21 (L) >60 mL/min    Comment: (NOTE) The  eGFR has been calculated using the CKD EPI equation. This calculation has not been validated in all clinical situations. eGFR's persistently <60 mL/min signify possible Chronic Kidney Disease.    Anion gap 12 5 - 15  CBC     Status: Abnormal   Collection Time: 10/14/15  4:07 AM  Result Value Ref Range   WBC 4.3 3.6 - 11.0 K/uL   RBC 4.79 3.80 - 5.20 MIL/uL   Hemoglobin 14.1 12.0 - 16.0 g/dL   HCT 42.6 35.0 - 47.0 %   MCV 89.1 80.0 - 100.0 fL   MCH 29.6 26.0 - 34.0 pg   MCHC 33.2 32.0 - 36.0 g/dL   RDW 16.5 (H) 11.5 - 14.5 %   Platelets 108 (L) 150 - 440 K/uL    Dg Chest 2 View  Result Date: 10/13/2015 CLINICAL DATA:  Shortness of breath. EXAM: CHEST  2 VIEW COMPARISON:  Radiograph of August 01, 2015. FINDINGS: Stable cardiomediastinal silhouette. Left-sided pacemaker is unchanged in position. Atherosclerosis of thoracic aorta is noted. No pneumothorax is noted. Left lung is clear. Mildly increased right basilar opacity is noted concerning for edema or inflow with mild associated pleural effusion. Bony thorax is unremarkable. IMPRESSION: Aortic atherosclerosis. Mildly increased right basilar opacity concerning for edema or inflammation with mild associated pleural effusion. Electronically Signed   By: Marijo Conception, M.D.   On: 10/13/2015 13:07    Review of Systems  Constitutional: Positive for diaphoresis.  HENT: Positive for congestion.   Eyes: Negative.   Respiratory: Positive for shortness of breath.   Cardiovascular: Positive for orthopnea, leg swelling and PND.  Gastrointestinal: Negative.   Genitourinary: Negative.   Musculoskeletal: Negative.   Skin: Negative.   Neurological: Positive for dizziness and weakness.  Endo/Heme/Allergies: Negative.   Psychiatric/Behavioral: Negative.    Blood pressure 126/80, pulse (!) 123, temperature 97.9 F (36.6 C), temperature source Oral, resp. rate 17, height 4' 11"  (1.499 m), weight 52.8 kg (116 lb 4.8 oz), SpO2 90 %. Physical Exam   Nursing note and vitals reviewed. Constitutional: She is oriented to person, place, and time. She appears well-developed and well-nourished.  HENT:  Head: Normocephalic and atraumatic.  Eyes: Conjunctivae and EOM are normal. Pupils are equal, round, and reactive to light.  Neck: Normal range of motion. Neck supple. JVD present.  Cardiovascular: Normal rate, S1 normal and S2 normal.  An irregularly irregular rhythm present. PMI is displaced.  Exam reveals gallop.   Murmur heard.  Systolic murmur is present with a grade of 2/6  Respiratory: Effort normal and breath sounds normal.  GI: Soft. Bowel sounds are normal.  Musculoskeletal: Normal range of motion. She exhibits edema.  Neurological: She is alert and oriented to person, place, and time. She has normal reflexes.  Skin: Skin is warm and dry.  Psychiatric: She has a normal mood and affect.    Assessment/Plan: Congestive heart failure acute on chronic Diastolic dysfunction Edema Weight gain Dyspnea Hypertension Borderline troponins Hyperkalemia Chronic renal insufficiency stage 4 Possible mild urinary tract infection . PLAN Agree with admitted left myocardial infarction Continue telemetry follow-up cardiac enzymes and EKG Recommend supplemental oxygen therapy as needed Consider inhalers for shortness of breath Agree with IV diuretic therapy to help with heart failure and leg edema Recommend support stockings and elevation Recommend Kayexalate for hyperkalemia Hold ACE inhibitor therapy Consider nephrology evaluation and  follow-up Consider antibiotics for possible urinary tract infection Do not recommend cardiac cath Recommend conservative cardiology therapy at this point  [place] D. 10/14/2015, 5:32 PM

## 2015-10-14 NOTE — Progress Notes (Signed)
SATURATION QUALIFICATIONS: (This note is used to comply with regulatory documentation for home oxygen)  Patient Saturations on Room Air at Rest = 91%  Patient Saturations on Room Air while Ambulating = 83%  Patient Saturations on 2 Liters of oxygen while Ambulating = 91%  Please briefly explain why patient needs home oxygen:

## 2015-10-14 NOTE — Plan of Care (Signed)
Problem: Safety: Goal: Ability to remain free from injury will improve Outcome: Progressing Fall precautions in place  Problem: Pain Managment: Goal: General experience of comfort will improve Outcome: Progressing Prn medications  Problem: Tissue Perfusion: Goal: Risk factors for ineffective tissue perfusion will decrease Outcome: Progressing SQ heparin

## 2015-10-14 NOTE — Evaluation (Signed)
Physical Therapy Evaluation Patient Details Name: Catheryn BaconBeverly J Echeverri MRN: [id number] DOB: [birth year] Today's Date: 10/14/2015   History of Present Illness  presented to ER secondary to SOB, LE edema; admitted with CHF with mild pleural effusion.  Clinical Impression  Upon evaluation, patient alert and oriented to basic information; follows commands and demonstrates good insight/safety awareness.  Bilat UE/LE strength and ROM grossly symmetrical and WFL for basic transfers and mobility; 3+ pitting edema throughout LEs.  Able to complete sit/stand, basic transfers and gait (210') with RW, cga/close sup.  Slow and cautious with mobility, but no overt buckling or LOB.  Mod use of RW with all mobility efforts; recommend continued use at this time (for both balance and energy conservation). Sats maintained >90% at rest and with activity on RA throughout session.   Would benefit from skilled PT to address above deficits and promote optimal return to PLOF; Recommend transition to HHPT upon discharge from acute hospitalization.  SaO2 on room air at rest = 91% SaO2 on room air while ambulating = 90%       Follow Up Recommendations Home health PT    Equipment Recommendations  Rolling walker with 5" wheels    Recommendations for Other Services       Precautions / Restrictions Precautions Precautions: Fall;ICD/Pacemaker Restrictions Weight Bearing Restrictions: No      Mobility  Bed Mobility               General bed mobility comments: seated in recliner beginning/end of treatment session  Transfers Overall transfer level: Needs assistance Equipment used: Rolling walker (2 wheeled) Transfers: Sit to/from Stand Sit to Stand: Min guard            Ambulation/Gait Ambulation/Gait assistance: Min guard Ambulation Distance (Feet): 220 Feet Assistive device: Rolling walker (2 wheeled)     Gait velocity interpretation: <1.8 ft/sec, indicative of risk for recurrent falls General  Gait Details: reciprocal stepping pattern; very slow, but steady, cadence without buckling or LOB. Maintains sats >90% on RA with exertion.  Stairs            Wheelchair Mobility    Modified Rankin (Stroke Patients Only)       Balance Overall balance assessment: Needs assistance Sitting-balance support: No upper extremity supported;Feet supported Sitting balance-Leahy Scale: Good     Standing balance support: Bilateral upper extremity supported Standing balance-Leahy Scale: Fair                               Pertinent Vitals/Pain Pain Assessment: No/denies pain    Home Living Family/patient expects to be discharged to:: Private residence Living Arrangements: Alone (sister is in town visiting) Available Help at Discharge: Family;Available PRN/intermittently Type of Home: House Home Access: Stairs to enter Entrance Stairs-Rails: None Entrance Stairs-Number of Steps: 1 Home Layout: Laundry or work area in basement;Able to live on main level with bedroom/bathroom Home Equipment: Gilmer MorCane - single point      Prior Function Level of Independence: Independent               Hand Dominance        Extremity/Trunk Assessment   Upper Extremity Assessment: Overall WFL for tasks assessed           Lower Extremity Assessment: Overall WFL for tasks assessed (grossly at least 4/5 throughout bilat LEs; generalized edema, 3+, throughout distal LEs)         Communication   Communication: No  difficulties  Cognition Arousal/Alertness: Awake/alert Behavior During Therapy: WFL for tasks assessed/performed Overall Cognitive Status: Within Functional Limits for tasks assessed                      General Comments      Exercises Other Exercises Other Exercises: Seated LE therex, 1x10, AROM for muscular strength/endurance: ankle pumps, LAQs, marching.  Encouraged performance outside of therapy as HEP; patient voiced understanding/agreement.       Assessment/Plan    PT Assessment Patient needs continued PT services  PT Diagnosis Difficulty walking;Generalized weakness   PT Problem List Cardiopulmonary status limiting activity;Decreased activity tolerance;Decreased balance  PT Treatment Interventions DME instruction;Gait training;Stair training;Functional mobility training;Therapeutic activities;Therapeutic exercise;Balance training;Patient/family education   PT Goals (Current goals can be found in the Care Plan section) Acute Rehab PT Goals Patient Stated Goal: to get the strength back in my legs PT Goal Formulation: With patient Time For Goal Achievement: 10/28/15 Potential to Achieve Goals: Good    Frequency Min 2X/week   Barriers to discharge Decreased caregiver support      Co-evaluation               End of Session Equipment Utilized During Treatment: Gait belt Activity Tolerance: Patient tolerated treatment well Patient left: in chair;with call bell/phone within reach;with chair alarm set Nurse Communication: Mobility status         Time: 1610-9604 PT Time Calculation (min) (ACUTE ONLY): 21 min   Charges:   PT Evaluation $PT Eval Low Complexity: 1 Procedure PT Treatments $Therapeutic Exercise: 8-22 mins   PT G Codes:        Ammaar Encina H. Manson Passey, PT, DPT, NCS 10/14/15, 2:26 PM [phone]

## 2015-10-14 NOTE — Progress Notes (Signed)
[employer] [facility] [place] [facility]   PATIENT NAME: Erin Macias    MR#:  [id number]  DATE OF BIRTH:  09/17/1923  SUBJECTIVE:   Patient is here due to shortness of breath and noted to be in acute respiratory failure with hypoxia secondary to CHF. Feels a little bit better today. No chest pain, nausea vomiting or diaphoresis.  REVIEW OF SYSTEMS:    Review of Systems  Constitutional: Negative for chills and fever.  HENT: Negative for congestion and tinnitus.   Eyes: Negative for blurred vision and double vision.  Respiratory: Positive for shortness of breath and wheezing. Negative for cough.   Cardiovascular: Positive for leg swelling. Negative for chest pain, orthopnea and PND.  Gastrointestinal: Negative for abdominal pain, diarrhea, nausea and vomiting.  Genitourinary: Negative for dysuria and hematuria.  Neurological: Negative for dizziness, sensory change and focal weakness.  All other systems reviewed and are negative.   Nutrition: Heart Healthy Tolerating Diet: Yes Tolerating PT: Eval noted.      DRUG ALLERGIES:   Allergies  Allergen Reactions  . Codeine Nausea And Vomiting    VITALS:  Blood pressure 126/80, pulse (!) 123, temperature 97.9 F (36.6 C), temperature source Oral, resp. rate 17, height 4\' 11"  (1.499 m), weight 52.8 kg (116 lb 4.8 oz), SpO2 90 %.  PHYSICAL EXAMINATION:   Physical Exam  GENERAL:  82 y.o.-year-old patient sitting up in chair in no acute distress.  EYES: Pupils equal, round, reactive to light and accommodation. No scleral icterus. Extraocular muscles intact.  HEENT: Head atraumatic, normocephalic. Oropharynx and nasopharynx clear.  NECK:  Supple, + jugular venous distention. No thyroid enlargement, no tenderness.  LUNGS: Normal breath sounds bilaterally, no wheezing, rhonchi, bibasilar rales. No use of accessory muscles of respiration.  CARDIOVASCULAR: S1, S2 normal. No murmurs, rubs, or gallops.  ABDOMEN: Soft,  nontender, nondistended. Bowel sounds present. No organomegaly or mass.  EXTREMITIES: No cyanosis, clubbing, +2 edema b/l.    NEUROLOGIC: Cranial nerves II through XII are intact. No focal Motor or sensory deficits b/l.   PSYCHIATRIC: The patient is alert and oriented x 3.  SKIN: No obvious rash, lesion, or ulcer.    LABORATORY PANEL:   CBC  Recent Labs Lab 10/14/15 0407  WBC 4.3  HGB 14.1  HCT 42.6  PLT 108*   ------------------------------------------------------------------------------------------------------------------  Chemistries   Recent Labs Lab 10/13/15 1157  10/14/15 0407  NA 138  < > 140  K 5.8*  < > 3.8  CL 99*  < > 97*  CO2 30  < > 31  GLUCOSE 141*  < > 152*  BUN 71*  < > 73*  CREATININE 2.25*  < > 2.23*  CALCIUM 9.9  < > 9.5  MG 2.4  --   --   AST 39  --   --   ALT 24  --   --   ALKPHOS 63  --   --   BILITOT 1.1  --   --   < > = values in this interval not displayed. ------------------------------------------------------------------------------------------------------------------  Cardiac Enzymes  Recent Labs Lab 10/13/15 1157  TROPONINI 0.04*   ------------------------------------------------------------------------------------------------------------------  RADIOLOGY:  Dg Chest 2 View  Result Date: 10/13/2015 CLINICAL DATA:  Shortness of breath. EXAM: CHEST  2 VIEW COMPARISON:  Radiograph of August 01, 2015. FINDINGS: Stable cardiomediastinal silhouette. Left-sided pacemaker is unchanged in position. Atherosclerosis of thoracic aorta is noted. No pneumothorax is noted. Left lung is clear. Mildly increased right basilar opacity is noted  concerning for edema or inflow with mild associated pleural effusion. Bony thorax is unremarkable. IMPRESSION: Aortic atherosclerosis. Mildly increased right basilar opacity concerning for edema or inflammation with mild associated pleural effusion. Electronically Signed   By: Lupita Raider, M.D.   On: 10/13/2015  13:07     ASSESSMENT AND PLAN:   82 year old female with past medical history of hypertension, COPD, chronic kidney disease stage III, history of CHF, atrial fibrillation who presents to the hospital due to shortness of breath.  1. CHF-acute on chronic diastolic dysfunction. -Patient's echocardiogram showed normal ejection fraction. -Continue diuresis with IV Lasix, follow I's and O's and daily weights. Clinically improving. Await further cardiology input. -Continue Coreg  2. Acute respiratory failure with hypoxia-secondary to CHF. -Continue management as mentioned above. We off oxygen is tolerated.  3. COPD-no acute exacerbation. -Continue Dulera, Spiriva.  4. Hyperkalemia-improved with Kayexalate. Will monitor.  5. Acute on chronic renal failure-secondary to underlying CHF. -Baseline creatinine around 1.6. Currently elevated at 2.2. Continue diuresis with Lasix and will follow BUN/creatinine.   All the records are reviewed and case discussed with Care Management/Social Worker. Management plans discussed with the patient, family and they are in agreement.  CODE STATUS: DNR  DVT Prophylaxis: Heparin SQ  TOTAL TIME TAKING CARE OF THIS PATIENT: 30 minutes.   POSSIBLE D/C IN 2-3 DAYS, DEPENDING ON CLINICAL CONDITION.   Houston Siren M.D on 10/14/2015 at 2:37 PM  Between 7am to 6pm - Pager - [phone]  After 6pm go to [email or web address] - Social research officer, government  [employer] [place] [facility]  Office  [phone]  CC: Primary care physician; BABAOFF, Lavada Mesi, MD

## 2015-10-14 NOTE — Care Management (Signed)
Patient presents from home with shortness of breath and diagnosed with CHF.  Patient doe not have home 02.  She has been declining with respiratory sx for approx 2 months and says she was seen by physicians during this time.  Prior to this episode of illness, she was independent in all adls and able to drive.  She has a cane in the home that she uses on occasion when she can find it.  She is current with her pcp and no issues accessing medical care or obtaining medications.  She is agreeable to home health services and provided her with list of agencies for choice.

## 2015-10-15 ENCOUNTER — Inpatient Hospital Stay: Payer: Medicare Other

## 2015-10-15 LAB — BASIC METABOLIC PANEL
Anion gap: 11 (ref 5–15)
BUN: 85 mg/dL — AB (ref 6–20)
CHLORIDE: 96 mmol/L — AB (ref 101–111)
CO2: 32 mmol/L (ref 22–32)
CREATININE: 2.25 mg/dL — AB (ref 0.44–1.00)
Calcium: 9.2 mg/dL (ref 8.9–10.3)
GFR calc Af Amer: 21 mL/min — ABNORMAL LOW (ref >60)
GFR, EST NON AFRICAN AMERICAN: 18 mL/min — AB (ref >60)
GLUCOSE: 109 mg/dL — AB (ref 65–99)
Potassium: 3.4 mmol/L — ABNORMAL LOW (ref 3.5–5.1)
SODIUM: 139 mmol/L (ref 135–145)

## 2015-10-15 MED ORDER — GUAIFENESIN-DM 100-10 MG/5ML PO SYRP
5.0000 mL | ORAL_SOLUTION | ORAL | Status: DC | PRN
  Administered 2015-10-15 – 2015-10-18 (×3): 5 mL via ORAL
  Filled 2015-10-15 (×3): qty 5

## 2015-10-15 MED ORDER — FUROSEMIDE 10 MG/ML IJ SOLN
20.0000 mg | Freq: Two times a day (BID) | INTRAMUSCULAR | Status: DC
  Administered 2015-10-15 – 2015-10-17 (×4): 20 mg via INTRAVENOUS
  Filled 2015-10-15 (×4): qty 2

## 2015-10-15 MED ORDER — METOPROLOL TARTRATE 5 MG/5ML IV SOLN: 5.0000 mg | Freq: Once | INTRAVENOUS | Status: DC

## 2015-10-15 MED ORDER — MENTHOL 3 MG MT LOZG
1.0000 | LOZENGE | OROMUCOSAL | Status: DC | PRN
  Filled 2015-10-15: qty 9

## 2015-10-15 NOTE — Progress Notes (Signed)
Physical Therapy Treatment Patient Details Name: Erin BaconBeverly J Macias MRN: [id number] DOB: [birth year] Today's Date: 10/15/2015    History of Present Illness presented to ER secondary to SOB, LE edema; admitted with CHF with mild pleural effusion.    PT Comments    Pt lethargic, but awake and agreeable to PT. Pt agreeable to up in chair only. Pt notes fatigue from lack of sleep and complains of sore throat. Heart rate pre treatment ranges from 66-114 beats per minute. Once in chair, pt notes need to use the bathroom. Pt ambulates to bathroom 25 feet with supervision for bathroom and hand washing activities. Pt requires cues for hand placement with sit to chair and commode. Pt ambulates back to chair slowly, but safely; pt denies distress. O2 saturation post ambulation and bathroom activities on room air 88% with heart rate 130 beats per minute. O2 replaced and quickly recovers to 97%; heart rate decreases to 123 beats per minute and remains for a minute. Nursing assistant in room and will continue to monitor. Continue PT to progress strength and endurance to progress all functional mobility and safety to ensure optimal, safe return home.   Follow Up Recommendations  Home health PT     Equipment Recommendations  Rolling walker with 5" wheels    Recommendations for Other Services       Precautions / Restrictions Precautions Precautions: Fall;ICD/Pacemaker Restrictions Weight Bearing Restrictions: No    Mobility  Bed Mobility Overal bed mobility: Modified Independent             General bed mobility comments: use of rail  Transfers Overall transfer level: Needs assistance Equipment used: Rolling walker (2 wheeled) Transfers: Sit to/from Stand Sit to Stand: Supervision (from bed, chair and commode)         General transfer comment: Requires cues initially for safe hand placement for sit  Ambulation/Gait Ambulation/Gait assistance: Min guard Ambulation Distance (Feet): 25  Feet (2x) Assistive device: Rolling walker (2 wheeled) Gait Pattern/deviations: Step-through pattern;Trunk flexed Gait velocity: reduced Gait velocity interpretation: <1.8 ft/sec, indicative of risk for recurrent falls General Gait Details: slow cadence, no LOB; mouth breathing with O2 saturation on room air down to 88% post ambulation/use of bathroom to void. HR to 130 bpm   Stairs            Wheelchair Mobility    Modified Rankin (Stroke Patients Only)       Balance Overall balance assessment: Needs assistance Sitting-balance support: Feet supported Sitting balance-Leahy Scale: Good     Standing balance support: Bilateral upper extremity supported;No upper extremity supported Standing balance-Leahy Scale: Fair                      Cognition Arousal/Alertness: Lethargic Behavior During Therapy: WFL for tasks assessed/performed Overall Cognitive Status: Within Functional Limits for tasks assessed                      Exercises      General Comments        Pertinent Vitals/Pain Pain Assessment: No/denies pain    Home Living                      Prior Function            PT Goals (current goals can now be found in the care plan section) Progress towards PT goals: Progressing toward goals    Frequency  Min 2X/week    PT Plan  Current plan remains appropriate    Co-evaluation             End of Session Equipment Utilized During Treatment: Gait belt Activity Tolerance: Patient tolerated treatment well;Other (comment) (mild decrease in O2 sats on room air to 88%; HR to 130) Patient left: in chair;with call bell/phone within reach;with chair alarm set     Time: 6962-9528 PT Time Calculation (min) (ACUTE ONLY): 23 min  Charges:  $Gait Training: 8-22 mins $Therapeutic Activity: 8-22 mins                    G CodesScot Dock, PTA 10/15/2015, 12:18 PM

## 2015-10-15 NOTE — Progress Notes (Signed)
Initial Heart Failure Clinic appointment scheduled for October 31, 2015 at 11:00am. Thank you.

## 2015-10-15 NOTE — Progress Notes (Signed)
[employer] - [place] [facility]   PATIENT NAME: Erin BlonderBeverly Hostetler    MR#:  [id number]  DATE OF BIRTH:  Jun 29, 1923  SUBJECTIVE:   Patient is here due to shortness of breath and noted to be in acute respiratory failure with hypoxia secondary to CHF. Feels a little bit better today. No chest pain, nausea vomiting or diaphoresis.  REVIEW OF SYSTEMS:    Review of Systems  Constitutional: Negative for chills and fever.  HENT: Negative for congestion and tinnitus.   Eyes: Negative for blurred vision and double vision.  Respiratory: Positive for shortness of breath and wheezing. Negative for cough.   Cardiovascular: Positive for leg swelling. Negative for chest pain, orthopnea and PND.  Gastrointestinal: Negative for abdominal pain, diarrhea, nausea and vomiting.  Genitourinary: Negative for dysuria and hematuria.  Neurological: Negative for dizziness, sensory change and focal weakness.  All other systems reviewed and are negative.   Nutrition: Heart Healthy Tolerating Diet: Yes Tolerating PT: Eval noted.      DRUG ALLERGIES:   Allergies  Allergen Reactions  . Codeine Nausea And Vomiting    VITALS:  Blood pressure (!) 90/53, pulse (!) 123, temperature 97.6 F (36.4 C), resp. rate 18, height 4\' 11"  (1.499 m), weight 55.1 kg (121 lb 6.4 oz), SpO2 98 %.  PHYSICAL EXAMINATION:   Physical Exam  GENERAL:  82 y.o.-year-old patient sitting up in chair in no acute distress.  EYES: Pupils equal, round, reactive to light and accommodation. No scleral icterus. Extraocular muscles intact.  HEENT: Head atraumatic, normocephalic. Oropharynx and nasopharynx clear.  NECK:  Supple, + jugular venous distention. No thyroid enlargement, no tenderness.  LUNGS: Normal breath sounds bilaterally, no wheezing, rhonchi, bibasilar rales. No use of accessory muscles of respiration.  CARDIOVASCULAR: S1, S2 normal. No murmurs, rubs, or gallops.  ABDOMEN: Soft, nontender, nondistended.  Bowel sounds present. No organomegaly or mass.  EXTREMITIES: No cyanosis, clubbing, +2 edema b/l.    NEUROLOGIC: Cranial nerves II through XII are intact. No focal Motor or sensory deficits b/l.   PSYCHIATRIC: The patient is alert and oriented x 3.  SKIN: No obvious rash, lesion, or ulcer.    LABORATORY PANEL:   CBC  Recent Labs Lab 10/14/15 0407  WBC 4.3  HGB 14.1  HCT 42.6  PLT 108*   ------------------------------------------------------------------------------------------------------------------  Chemistries   Recent Labs Lab 10/13/15 1157  10/15/15 0349  NA 138  < > 139  K 5.8*  < > 3.4*  CL 99*  < > 96*  CO2 30  < > 32  GLUCOSE 141*  < > 109*  BUN 71*  < > 85*  CREATININE 2.25*  < > 2.25*  CALCIUM 9.9  < > 9.2  MG 2.4  --   --   AST 39  --   --   ALT 24  --   --   ALKPHOS 63  --   --   BILITOT 1.1  --   --   < > = values in this interval not displayed. ------------------------------------------------------------------------------------------------------------------  Cardiac Enzymes  Recent Labs Lab 10/13/15 1157  TROPONINI 0.04*   ------------------------------------------------------------------------------------------------------------------  RADIOLOGY:  No results found.   ASSESSMENT AND PLAN:   82 year old female with past medical history of hypertension, COPD, chronic kidney disease stage III, history of CHF, atrial fibrillation who presents to the hospital due to shortness of breath.  1. CHF-acute on chronic diastolic dysfunction. -Patient's echocardiogram showed normal ejection fraction. -Continue diuresis with IV Lasix but will  taper, about 2 L (-) since admission.  - Clinically improving. Await further cardiology input. -Continue Coreg  2. Acute respiratory failure with hypoxia-secondary to CHF. -Continue management as mentioned above. We off oxygen is tolerated.  3. A. Fib w/ RVR - pt. Noted to be tachy, hypotensive this afternoon.   - will give some IV PRN Metoprolol and monitor.  Consider starting Amio.    4. COPD-no acute exacerbation. -Continue Dulera, Spiriva.  5. Hyperkalemia-improved with Kayexalate. Will monitor.  6. Acute on chronic renal failure-secondary to underlying CHF. -Baseline creatinine around 1.6. Currently elevated at 2.2. Continue diuresis with Lasix but will taper as Cr. Not improving and will monitor.  - follow BUN/creatinine.   All the records are reviewed and case discussed with Care Management/Social Worker. Management plans discussed with the patient, family and they are in agreement.  CODE STATUS: DNR  DVT Prophylaxis: Heparin SQ  TOTAL TIME TAKING CARE OF THIS PATIENT: 30 minutes.   POSSIBLE D/C IN 2-3 DAYS, DEPENDING ON CLINICAL CONDITION.   Houston Siren M.D on 10/15/2015 at 1:28 PM  Between 7am to 6pm - Pager - [phone]  After 6pm go to [email or web address] - Social research officer, government  [employer] [place] [facility]  Office  [phone]  CC: Primary care physician; BABAOFF, Lavada Mesi, MD

## 2015-10-16 LAB — BASIC METABOLIC PANEL
Anion gap: 9 (ref 5–15)
BUN: 83 mg/dL — ABNORMAL HIGH (ref 6–20)
CALCIUM: 9 mg/dL (ref 8.9–10.3)
CO2: 38 mmol/L — ABNORMAL HIGH (ref 22–32)
CREATININE: 2.05 mg/dL — AB (ref 0.44–1.00)
Chloride: 93 mmol/L — ABNORMAL LOW (ref 101–111)
GFR calc non Af Amer: 20 mL/min — ABNORMAL LOW (ref >60)
GFR, EST AFRICAN AMERICAN: 23 mL/min — AB (ref >60)
Glucose, Bld: 106 mg/dL — ABNORMAL HIGH (ref 65–99)
Potassium: 3.1 mmol/L — ABNORMAL LOW (ref 3.5–5.1)
SODIUM: 140 mmol/L (ref 135–145)

## 2015-10-16 MED ORDER — POTASSIUM CHLORIDE CRYS ER 20 MEQ PO TBCR
20.0000 meq | EXTENDED_RELEASE_TABLET | Freq: Three times a day (TID) | ORAL | Status: DC
  Administered 2015-10-16 – 2015-10-17 (×5): 20 meq via ORAL
  Filled 2015-10-16 (×5): qty 1

## 2015-10-16 NOTE — Progress Notes (Signed)
Physical Therapy Treatment Patient Details Name: Erin Macias MRN: [id number] DOB: Jun 28, 1923 Today's Date: 10/16/2015    History of Present Illness presented to ER secondary to SOB, LE edema; admitted with CHF with mild pleural effusion.    PT Comments    Pt agreeable to PT; reports feeling a little better. Initial heart rate noted to range between 71 and 120 beats per minute; spoke with nursing prior to ambulation as well as telemetry center. Nursing notes heart rate difference is due to pacemaker "kicking" in versus not. Pt okay to ambulate. Pt improved ambulation distance with slow, steady pace using rolling walker; O2 saturation levels remain above 94% post ambulation and use of bathroom. Pt able to perform head turns looking at boards on the wall without loss of balance. Pt continues to require safety cues for chair approach to turn body around using rolling walker and to be mindful of O2 tubing. Pt demonstrates ability to sit/stand with supervision from bed, recliner and commode. Pt up in chair post session. Continue PT to progress ambulation distance and safety with rolling walker to allow for optimal, safe return home.   Follow Up Recommendations  Home health PT     Equipment Recommendations  Rolling walker with 5" wheels    Recommendations for Other Services       Precautions / Restrictions Precautions Precautions: Fall;ICD/Pacemaker Restrictions Weight Bearing Restrictions: No    Mobility  Bed Mobility Overal bed mobility: Modified Independent             General bed mobility comments: use of rail  Transfers Overall transfer level: Needs assistance Equipment used: Rolling walker (2 wheeled) Transfers: Sit to/from Stand Sit to Stand: Supervision (from bed, recliner and commode)         General transfer comment: cues for safe chair approach, mindful of O2 cord for safety  Ambulation/Gait Ambulation/Gait assistance: Min guard;Supervision Ambulation  Distance (Feet): 230 Feet Assistive device: Rolling walker (2 wheeled) Gait Pattern/deviations: Step-through pattern;WFL(Within Functional Limits)   Gait velocity interpretation: at or above normal speed for age/gender General Gait Details: slow, but steady cadence, no LOB; mouth breathing with O2 saturation on room air down to 88% post ambulation/use of bathroom to void. HR to 130 bpm   Stairs            Wheelchair Mobility    Modified Rankin (Stroke Patients Only)       Balance Overall balance assessment: Needs assistance Sitting-balance support: Feet supported Sitting balance-Leahy Scale: Good     Standing balance support: Bilateral upper extremity supported Standing balance-Leahy Scale: Fair (Fair +)                      Cognition Arousal/Alertness: Awake/alert Behavior During Therapy: WFL for tasks assessed/performed Overall Cognitive Status: Within Functional Limits for tasks assessed                      Exercises      General Comments        Pertinent Vitals/Pain Pain Assessment: No/denies pain    Home Living                      Prior Function            PT Goals (current goals can now be found in the care plan section) Progress towards PT goals: Progressing toward goals    Frequency  Min 2X/week    PT Plan Current plan  remains appropriate    Co-evaluation             End of Session Equipment Utilized During Treatment: Gait belt Activity Tolerance: Patient tolerated treatment well Patient left: in chair;with call bell/phone within reach;with chair alarm set;with nursing/sitter in room;with family/visitor present     Time: [id number] PT Time Calculation (min) (ACUTE ONLY): 24 min  Charges:  $Gait Training: 8-22 mins $Therapeutic Activity: 8-22 mins                    G CodesScot Dock:      Heidi E Barnes, PTA 10/16/2015, 4:47 PM

## 2015-10-16 NOTE — Care Management Important Message (Signed)
Important Message  Patient Details  Name: Erin Macias MRN: [id number] Date of Birth: September 10, 1923   Medicare Important Message Given:  Yes    Eber HongGreene, Jamarl Pew R, RN 10/16/2015, 9:12 AM

## 2015-10-16 NOTE — Care Management (Signed)
Anticipate discharge to home 9.15.  She continues to qualify for home oxygen.  Agency preference is [facility].  She is also agreeable to nursing and physical therapy.   Notified [facility].  Patient's sister will be staying with patient until her condition improves

## 2015-10-16 NOTE — Progress Notes (Signed)
[employer] - [place] at [facility]   PATIENT NAME: Erin Macias    MR#:  [id number]  DATE OF BIRTH:  1923/02/18  SUBJECTIVE:   Patient is here due to shortness of breath and noted to be in acute respiratory failure with hypoxia secondary to CHF.  Feels a bit weak today and not back to her baseline.    REVIEW OF SYSTEMS:    Review of Systems  Constitutional: Negative for chills and fever.  HENT: Negative for congestion and tinnitus.   Eyes: Negative for blurred vision and double vision.  Respiratory: Positive for shortness of breath. Negative for cough and wheezing.   Cardiovascular: Positive for leg swelling. Negative for chest pain, orthopnea and PND.  Gastrointestinal: Negative for abdominal pain, diarrhea, nausea and vomiting.  Genitourinary: Negative for dysuria and hematuria.  Neurological: Negative for dizziness, sensory change and focal weakness.  All other systems reviewed and are negative.   Nutrition: Heart Healthy Tolerating Diet: Yes Tolerating PT: Eval noted.    DRUG ALLERGIES:   Allergies  Allergen Reactions  . Codeine Nausea And Vomiting    VITALS:  Blood pressure (!) 104/46, pulse 67, temperature 97.4 F (36.3 C), temperature source Oral, resp. rate 18, height 4\' 11"  (1.499 m), weight 53.4 kg (117 lb 12.8 oz), SpO2 99 %.  PHYSICAL EXAMINATION:   Physical Exam  GENERAL:  82 y.o.-year-old patient lying in bed in no acute distress.  EYES: Pupils equal, round, reactive to light and accommodation. No scleral icterus. Extraocular muscles intact.  HEENT: Head atraumatic, normocephalic. Oropharynx and nasopharynx clear.  NECK:  Supple, + jugular venous distention. No thyroid enlargement, no tenderness.  LUNGS: Normal breath sounds bilaterally, no wheezing, rhonchi, bibasilar rales. No use of accessory muscles of respiration.  CARDIOVASCULAR: S1, S2 normal. No murmurs, rubs, or gallops.  ABDOMEN: Soft, nontender, nondistended. Bowel sounds  present. No organomegaly or mass.  EXTREMITIES: No cyanosis, clubbing, +1 edema b/l.    NEUROLOGIC: Cranial nerves II through XII are intact. No focal Motor or sensory deficits b/l.   PSYCHIATRIC: The patient is alert and oriented x 3.  SKIN: No obvious rash, lesion, or ulcer.    LABORATORY PANEL:   CBC  Recent Labs Lab 10/14/15 0407  WBC 4.3  HGB 14.1  HCT 42.6  PLT 108*   ------------------------------------------------------------------------------------------------------------------  Chemistries   Recent Labs Lab 10/13/15 1157  10/16/15 0517  NA 138  < > 140  K 5.8*  < > 3.1*  CL 99*  < > 93*  CO2 30  < > 38*  GLUCOSE 141*  < > 106*  BUN 71*  < > 83*  CREATININE 2.25*  < > 2.05*  CALCIUM 9.9  < > 9.0  MG 2.4  --   --   AST 39  --   --   ALT 24  --   --   ALKPHOS 63  --   --   BILITOT 1.1  --   --   < > = values in this interval not displayed. ------------------------------------------------------------------------------------------------------------------  Cardiac Enzymes  Recent Labs Lab 10/13/15 1157  TROPONINI 0.04*   ------------------------------------------------------------------------------------------------------------------  RADIOLOGY:  Dg Chest 2 View  Result Date: 10/15/2015 CLINICAL DATA:  Shortness of breath EXAM: CHEST  2 VIEW COMPARISON:  10/13/2015 chest radiograph. FINDINGS: Stable configuration of 2 lead left subclavian pacemaker with lead tips in the right atrium and right ventricular apex. Stable cardiomediastinal silhouette with mild cardiomegaly and aortic atherosclerosis. No pneumothorax. Small bilateral pleural effusions,  stable on the right and slightly increased on the left. Cephalization of the pulmonary vasculature without overt pulmonary edema. Mild bibasilar compressive atelectasis. IMPRESSION: 1. Stable cardiomegaly without overt pulmonary edema. 2. Small bilateral pleural effusions, stable on the right and slightly increased  on the left. 3. Mild bibasilar atelectasis. 4. Aortic atherosclerosis. Electronically Signed   By: Delbert PhenixJason A Poff M.D.   On: 10/15/2015 13:55     ASSESSMENT AND PLAN:   82 year old female with past medical history of hypertension, COPD, chronic kidney disease stage III, history of CHF, atrial fibrillation who presents to the hospital due to shortness of breath.  1. CHF-acute on chronic diastolic dysfunction. -Patient's echocardiogram showed normal ejection fraction. -Continue diuresis with IV Lasix but will taper, about 2.5 L (-) since admission.  - Clinically improving. Appreciate Cardiology help.   -Continue Coreg  2. Acute respiratory failure with hypoxia-secondary to CHF. -Continue management as mentioned above.  - may need  Home O2 upon discharge.   3. A. Fib w/ RVR - pt. Noted to be tachy, hypotensive yesterday afternoon.  - cont. Coreg for rate control and rates are labile and will monitor.  - have cards consider Long-term anticoagulation as outpatient.   4. COPD-no acute exacerbation. -Continue Dulera, Spiriva.  5. Hypokalemia- will place on supplements and repeat in a.m.   6. Acute on chronic renal failure-secondary to underlying CHF. -Baseline creatinine around 1.6. Currently elevated at 2.0. Continue diuresis with Lasix  - follow BUN/creatinine.  Possible d/c home tomorrow with Home health services.   All the records are reviewed and case discussed with Care Management/Social Worker. Management plans discussed with the patient, family and they are in agreement.  CODE STATUS: DNR  DVT Prophylaxis: Heparin SQ  TOTAL TIME TAKING CARE OF THIS PATIENT: 30 minutes.   POSSIBLE D/C IN 1-2 DAYS, DEPENDING ON CLINICAL CONDITION.   [place] SirenSAINANI,Ej Pinson J M.D on 10/16/2015 at 2:39 PM  Between 7am to 6pm - Pager - [phone]  After 6pm go to [email or web address] - Social research officer, governmentpassword EPAS [email or web address]  [employer] [place] [facility]  Office  [phone]  CC: Primary care physician;  BABAOFF, Lavada MesiMARC E, MD

## 2015-10-16 NOTE — Progress Notes (Signed)
SATURATION QUALIFICATIONS: (This note is used to comply with regulatory documentation for home oxygen)  Patient Saturations on Room Air at Rest = 88%  Patient Saturations on Room Air while Ambulating = NA  Patient Saturations on 2 Liters of oxygen while at rest = 95%  Please briefly explain why patient needs home oxygen:

## 2015-10-17 LAB — MAGNESIUM: MAGNESIUM: 1.8 mg/dL (ref 1.7–2.4)

## 2015-10-17 LAB — BASIC METABOLIC PANEL
ANION GAP: 10 (ref 5–15)
BUN: 71 mg/dL — AB (ref 6–20)
CHLORIDE: 94 mmol/L — AB (ref 101–111)
CO2: 38 mmol/L — AB (ref 22–32)
Calcium: 8.9 mg/dL (ref 8.9–10.3)
Creatinine, Ser: 1.51 mg/dL — ABNORMAL HIGH (ref 0.44–1.00)
GFR calc Af Amer: 34 mL/min — ABNORMAL LOW (ref >60)
GFR calc non Af Amer: 29 mL/min — ABNORMAL LOW (ref >60)
GLUCOSE: 100 mg/dL — AB (ref 65–99)
POTASSIUM: 3.1 mmol/L — AB (ref 3.5–5.1)
Sodium: 142 mmol/L (ref 135–145)

## 2015-10-17 LAB — CBC
HEMATOCRIT: 43.7 % (ref 35.0–47.0)
HEMOGLOBIN: 14.4 g/dL (ref 12.0–16.0)
MCH: 29.1 pg (ref 26.0–34.0)
MCHC: 33 g/dL (ref 32.0–36.0)
MCV: 88.2 fL (ref 80.0–100.0)
PLATELETS: 114 10*3/uL — AB (ref 150–440)
RBC: 4.95 MIL/uL (ref 3.80–5.20)
RDW: 16.3 % — AB (ref 11.5–14.5)
WBC: 6.9 10*3/uL (ref 3.6–11.0)

## 2015-10-17 MED ORDER — METOPROLOL TARTRATE 50 MG PO TABS
50.0000 mg | ORAL_TABLET | Freq: Two times a day (BID) | ORAL | Status: DC
  Administered 2015-10-17 – 2015-10-18 (×3): 50 mg via ORAL
  Filled 2015-10-17 (×3): qty 1

## 2015-10-17 MED ORDER — FUROSEMIDE 40 MG PO TABS
40.0000 mg | ORAL_TABLET | Freq: Every day | ORAL | Status: DC
  Administered 2015-10-17 – 2015-10-18 (×2): 40 mg via ORAL
  Filled 2015-10-17 (×2): qty 1

## 2015-10-17 NOTE — Care Management (Addendum)
Requested  primary  nurse re qualify patient for home 02 today if she does not discharge.  Her qualifying sats will expire 9.16 if she does not discharge. Would like to have sats to will take patient through the weekend

## 2015-10-17 NOTE — Care Management (Signed)
Had anticipated discharge today but now experiencing tachycardia with heart rate up to 125.  Cardiology to assess

## 2015-10-17 NOTE — Consult Note (Signed)
[name] Cardiology  CARDIOLOGY CONSULT NOTE  Patient ID: Erin Macias MRN: [id number] DOB/AGE: 06-09-1923 82 y.o.  Admit date: 10/13/2015 Referring Physician [name] Primary Physician [facility] Primary Cardiologist [name] Reason for Consultation Atrial fibrillation  HPI: 82 year old female referred for evaluation of atrial fibrillation. The patient has known history of chronic atrial fibrillation, essential hypertension, chronic diastolic congestive heart failure, COPD, and sick sinus syndrome status post pacemaker. The patient was admitted on 10/13/2015 with shortness of breath felt to be due to acute on chronic diastolic congestive heart failure. The patient has gradually improved with diuresis. On telemetry, the patient has atrial fibrillation with rapid ventricular rate with intermittent ventricular pacing. The patient denies chest pain or palpitations.  Review of systems complete and found to be negative unless listed above     Past Medical History:  Diagnosis Date  . Atrial fibrillation (HCC)   . CHF (congestive heart failure) (HCC)   . Chronic kidney disease   . COPD (chronic obstructive pulmonary disease) (HCC)   . Hypertension   . Pacemaker     Past Surgical History:  Procedure Laterality Date  . HIP ARTHROPLASTY    . PACEMAKER PLACEMENT      Prescriptions Prior to Admission  Medication Sig Dispense Refill Last Dose  . albuterol (PROVENTIL HFA;VENTOLIN HFA) 108 (90 Base) MCG/ACT inhaler Inhale 2 puffs into the lungs every 4 (four) hours as needed for wheezing or shortness of breath. 1 Inhaler 2 unknown at unknown  . aspirin EC 81 MG tablet Take 81 mg by mouth daily.    unknown at unknown  . Calcium Carbonate-Vit D-Min (CALCIUM 600+D PLUS MINERALS) 600-400 MG-UNIT TABS Take 1 tablet by mouth daily.   unknown at unknown  . carvedilol (COREG) 12.5 MG tablet Take 12.5 mg by mouth 2 (two) times daily.   unknown at unknown  . Fluticasone-Salmeterol (ADVAIR DISKUS) 250-50 MCG/DOSE  AEPB Inhale 1 puff into the lungs 2 (two) times daily. 60 each 0 unknown at unknown  . furosemide (LASIX) 20 MG tablet Take 20 mg by mouth daily. Dr told patient to take 40mg  daily for 3 days (starting 6/26) then take 20mg  daily after that   unknown at unknown  . Glucosamine-Chondroitin 250-200 MG TABS Take 1 tablet by mouth daily.   unknown at unknown  . lisinopril (PRINIVIL,ZESTRIL) 20 MG tablet Take 20 mg by mouth daily.    unknown at unknown  . Multiple Vitamin (MULTI-VITAMINS) TABS Take 1 tablet by mouth 2 (two) times daily.   unknown at unknown  . potassium chloride SA (K-DUR,KLOR-CON) 20 MEQ tablet Take 20 mEq by mouth 2 (two) times daily.   unknown at unknown  . tiotropium (SPIRIVA HANDIHALER) 18 MCG inhalation capsule Place 1 capsule (18 mcg total) into inhaler and inhale daily. 30 capsule 0 unknown at unknown   Social History   Social History  . Marital status: Widowed    Spouse name: N/A  . Number of children: N/A  . Years of education: N/A   Occupational History  . Not on file.   Social History Main Topics  . Smoking status: Current Every Day Smoker    Packs/day: 0.50    Types: Cigarettes  . Smokeless tobacco: Never Used  . Alcohol use No  . Drug use: Unknown  . Sexual activity: Not on file   Other Topics Concern  . Not on file   Social History Narrative  . No narrative on file    Family History  Problem Relation Age of Onset  .  Hypertension Mother   . Cancer Mother   . Heart attack Mother   . Kidney failure Mother   . Cancer Father   . Stroke Brother       Review of systems complete and found to be negative unless listed above      PHYSICAL EXAM  General: Well developed, well nourished, in no acute distress HEENT:  Normocephalic and atramatic Neck:  No JVD.  Lungs: Clear bilaterally to auscultation and percussion. Heart: HRRR . Normal S1 and S2 without gallops or murmurs.  Abdomen: Bowel sounds are positive, abdomen soft and non-tender  Msk:   Back normal, normal gait. Normal strength and tone for age. Extremities: No clubbing, cyanosis or edema.   Neuro: Alert and oriented X 3. Psych:  Good affect, responds appropriately  Labs:   Lab Results  Component Value Date   WBC 6.9 10/17/2015   HGB 14.4 10/17/2015   HCT 43.7 10/17/2015   MCV 88.2 10/17/2015   PLT 114 (L) 10/17/2015    Recent Labs Lab 10/13/15 1157  10/17/15 0414  NA 138  < > 142  K 5.8*  < > 3.1*  CL 99*  < > 94*  CO2 30  < > 38*  BUN 71*  < > 71*  CREATININE 2.25*  < > 1.51*  CALCIUM 9.9  < > 8.9  PROT 6.4*  --   --   BILITOT 1.1  --   --   ALKPHOS 63  --   --   ALT 24  --   --   AST 39  --   --   GLUCOSE 141*  < > 100*  < > = values in this interval not displayed. Lab Results  Component Value Date   CKMB 6.7 (H) 02/14/2011   TROPONINI 0.04 (HH) 10/13/2015   No results found for: CHOL No results found for: HDL No results found for: LDLCALC No results found for: TRIG No results found for: CHOLHDL No results found for: LDLDIRECT    Radiology: Dg Chest 2 View  Result Date: 10/15/2015 CLINICAL DATA:  Shortness of breath EXAM: CHEST  2 VIEW COMPARISON:  10/13/2015 chest radiograph. FINDINGS: Stable configuration of 2 lead left subclavian pacemaker with lead tips in the right atrium and right ventricular apex. Stable cardiomediastinal silhouette with mild cardiomegaly and aortic atherosclerosis. No pneumothorax. Small bilateral pleural effusions, stable on the right and slightly increased on the left. Cephalization of the pulmonary vasculature without overt pulmonary edema. Mild bibasilar compressive atelectasis. IMPRESSION: 1. Stable cardiomegaly without overt pulmonary edema. 2. Small bilateral pleural effusions, stable on the right and slightly increased on the left. 3. Mild bibasilar atelectasis. 4. Aortic atherosclerosis. Electronically Signed   By: Delbert PhenixJason A Poff M.D.   On: 10/15/2015 13:55   Dg Chest 2 View  Result Date: 10/13/2015 CLINICAL DATA:   Shortness of breath. EXAM: CHEST  2 VIEW COMPARISON:  Radiograph of August 01, 2015. FINDINGS: Stable cardiomediastinal silhouette. Left-sided pacemaker is unchanged in position. Atherosclerosis of thoracic aorta is noted. No pneumothorax is noted. Left lung is clear. Mildly increased right basilar opacity is noted concerning for edema or inflow with mild associated pleural effusion. Bony thorax is unremarkable. IMPRESSION: Aortic atherosclerosis. Mildly increased right basilar opacity concerning for edema or inflammation with mild associated pleural effusion. Electronically Signed   By: Lupita RaiderJames  Green Jr, M.D.   On: 10/13/2015 13:07    EKG: Atrial fibrillation with intermittent ventricular pacing  ASSESSMENT AND PLAN:   1. Chronic fibrillation,  with rapid ventricular rate, asymptomatic, with intermittent ventricular pacing 2. Acute on chronic diastolic congestive heart failure, improved after diuresis  Recommendations  1. Agree with overall current therapy 2. Change carvedilol to metoprolol tartrate 50 mg twice a day, uptitrate as needed 3. Defer initiation of chronic anticoagulation, this may be done as outpatient with Dr. [name]  Signed: Marcina Millard MD,PhD, [facility] 10/17/2015, 1:53 PM

## 2015-10-17 NOTE — Progress Notes (Signed)
[employer] - [place] at [facility]   PATIENT NAME: Erin Macias    MR#:  [id number]  DATE OF BIRTH:  07/09/1923  SUBJECTIVE:   Patient is here due to shortness of breath and noted to be in acute respiratory failure with hypoxia secondary to CHF.   Shortness of breath much improved.  HR have been labile.  REVIEW OF SYSTEMS:    Review of Systems  Constitutional: Negative for chills and fever.  HENT: Negative for congestion and tinnitus.   Eyes: Negative for blurred vision and double vision.  Respiratory: Positive for shortness of breath. Negative for cough and wheezing.   Cardiovascular: Positive for leg swelling. Negative for chest pain, orthopnea and PND.  Gastrointestinal: Negative for abdominal pain, diarrhea, nausea and vomiting.  Genitourinary: Negative for dysuria and hematuria.  Neurological: Positive for weakness. Negative for dizziness, sensory change and focal weakness.  All other systems reviewed and are negative.   Nutrition: Heart Healthy Tolerating Diet: Yes Tolerating PT: Eval noted.    DRUG ALLERGIES:   Allergies  Allergen Reactions  . Codeine Nausea And Vomiting    VITALS:  Blood pressure 93/62, pulse 67, temperature 97.5 F (36.4 C), temperature source Oral, resp. rate 16, height 4\' 11"  (1.499 m), weight 53 kg (116 lb 12.8 oz), SpO2 94 %.  PHYSICAL EXAMINATION:   Physical Exam  GENERAL:  82 y.o.-year-old patient lying in bed in no acute distress.  EYES: Pupils equal, round, reactive to light and accommodation. No scleral icterus. Extraocular muscles intact.  HEENT: Head atraumatic, normocephalic. Oropharynx and nasopharynx clear.  NECK:  Supple, No jugular venous distention. No thyroid enlargement, no tenderness.  LUNGS: Normal breath sounds bilaterally, no wheezing, rhonchi, bibasilar rales. No use of accessory muscles of respiration.  CARDIOVASCULAR: S1, S2 Irregular. No murmurs, rubs, or gallops.  ABDOMEN: Soft, nontender,  non-distended. Bowel sounds present. No organomegaly or mass.  EXTREMITIES: No cyanosis, clubbing, +1 edema b/l.    NEUROLOGIC: Cranial nerves II through XII are intact. No focal Motor or sensory deficits b/l.  Globally weak.   PSYCHIATRIC: The patient is alert and oriented x 3.  SKIN: No obvious rash, lesion, or ulcer.    LABORATORY PANEL:   CBC  Recent Labs Lab 10/17/15 0414  WBC 6.9  HGB 14.4  HCT 43.7  PLT 114*   ------------------------------------------------------------------------------------------------------------------  Chemistries   Recent Labs Lab 10/13/15 1157  10/17/15 0414  NA 138  < > 142  K 5.8*  < > 3.1*  CL 99*  < > 94*  CO2 30  < > 38*  GLUCOSE 141*  < > 100*  BUN 71*  < > 71*  CREATININE 2.25*  < > 1.51*  CALCIUM 9.9  < > 8.9  MG 2.4  --  1.8  AST 39  --   --   ALT 24  --   --   ALKPHOS 63  --   --   BILITOT 1.1  --   --   < > = values in this interval not displayed. ------------------------------------------------------------------------------------------------------------------  Cardiac Enzymes  Recent Labs Lab 10/13/15 1157  TROPONINI 0.04*   ------------------------------------------------------------------------------------------------------------------  RADIOLOGY:  No results found.   ASSESSMENT AND PLAN:   82 year old female with past medical history of hypertension, COPD, chronic kidney disease stage III, history of CHF, atrial fibrillation who presents to the hospital due to shortness of breath.  1. CHF-acute on chronic diastolic dysfunction. -Patient's echocardiogram showed normal ejection fraction. -switch from IV Lasix to oral  Lasix but will taper, about 3.3 L (-) since admission.  - Clinically improving. Appreciate Cardiology help.   -Continue Coreg  2. Acute respiratory failure with hypoxia-secondary to CHF. -Continue management as mentioned above.  - may need  Home O2 upon discharge.   3. A. Fib w/ RVR - rates  have been labile.   - discussed w/ Cardiology and switch from Coreg to Metoprolol tart. And titrate up as tolerated.  - have cards consider Long-term anticoagulation as outpatient.   4. COPD-no acute exacerbation. -Continue Dulera, Spiriva.  5. Hypokalemia- cont. To supplement and will repeat in a.m. Tomorrow.  - check Mg.   6. Acute on chronic renal failure-secondary to underlying CHF. -Baseline creatinine around 1.6. Now down to 1.5 today w/ diuresis.  - follow BUN/creatinine.  D/c home with home health in next 1-2 days.   All the records are reviewed and case discussed with Care Management/Social Worker. Management plans discussed with the patient, family and they are in agreement.  CODE STATUS: DNR  DVT Prophylaxis: Heparin SQ  TOTAL TIME TAKING CARE OF THIS PATIENT: 30 minutes.   POSSIBLE D/C IN 1-2 DAYS, DEPENDING ON CLINICAL CONDITION.   [place] Siren M.D on 10/17/2015 at 2:29 PM  Between 7am to 6pm - Pager - [phone]  After 6pm go to [email or web address] - Social research officer, government  [employer] [place] [facility]  Office  [phone]  CC: Primary care physician; BABAOFF, Lavada Mesi, MD

## 2015-10-17 NOTE — Care Management (Addendum)
Requested order for home health SN PT and Aide and home 02 from attending.  Heart rate appears improved

## 2015-10-18 LAB — BASIC METABOLIC PANEL
ANION GAP: 8 (ref 5–15)
BUN: 69 mg/dL — ABNORMAL HIGH (ref 6–20)
CALCIUM: 9.2 mg/dL (ref 8.9–10.3)
CO2: 38 mmol/L — AB (ref 22–32)
CREATININE: 1.44 mg/dL — AB (ref 0.44–1.00)
Chloride: 93 mmol/L — ABNORMAL LOW (ref 101–111)
GFR calc non Af Amer: 31 mL/min — ABNORMAL LOW (ref >60)
GFR, EST AFRICAN AMERICAN: 36 mL/min — AB (ref >60)
Glucose, Bld: 106 mg/dL — ABNORMAL HIGH (ref 65–99)
Potassium: 3.4 mmol/L — ABNORMAL LOW (ref 3.5–5.1)
SODIUM: 139 mmol/L (ref 135–145)

## 2015-10-18 MED ORDER — CALCIUM 600+D PLUS MINERALS 600-400 MG-UNIT PO TABS: 1.0000 | ORAL_TABLET | Freq: Every day | ORAL | Status: DC

## 2015-10-18 MED ORDER — LISINOPRIL 5 MG PO TABS
5.0000 mg | ORAL_TABLET | Freq: Every day | ORAL | Status: DC
Start: 2015-10-18 — End: 2015-10-18
  Administered 2015-10-18: 5 mg via ORAL
  Filled 2015-10-18: qty 1

## 2015-10-18 MED ORDER — GLUCOSAMINE-CHONDROITIN 250-200 MG PO TABS: 1.0000 | ORAL_TABLET | Freq: Every day | ORAL | Status: DC

## 2015-10-18 MED ORDER — CALCIUM CARBONATE-VITAMIN D 500-200 MG-UNIT PO TABS
1.0000 | ORAL_TABLET | Freq: Every day | ORAL | Status: DC
  Administered 2015-10-18: 1 via ORAL
  Filled 2015-10-18: qty 1

## 2015-10-18 MED ORDER — POTASSIUM CHLORIDE CRYS ER 20 MEQ PO TBCR
20.0000 meq | EXTENDED_RELEASE_TABLET | Freq: Two times a day (BID) | ORAL | Status: DC
  Administered 2015-10-18: 20 meq via ORAL
  Filled 2015-10-18: qty 1

## 2015-10-18 MED ORDER — METOPROLOL TARTRATE 50 MG PO TABS: 50.0000 mg | ORAL_TABLET | Freq: Two times a day (BID) | ORAL | 1 refills | Status: AC

## 2015-10-18 MED ORDER — ADULT MULTIVITAMIN W/MINERALS CH
1.0000 | ORAL_TABLET | Freq: Two times a day (BID) | ORAL | Status: DC
  Administered 2015-10-18: 1 via ORAL
  Filled 2015-10-18: qty 1

## 2015-10-18 MED ORDER — FUROSEMIDE 40 MG PO TABS: 40.0000 mg | ORAL_TABLET | Freq: Every day | ORAL | 0 refills | Status: AC

## 2015-10-18 NOTE — Discharge Summary (Signed)
[facility] - [place] at [facility]   PATIENT NAME: Erin Macias    MR#:  [id number]  DATE OF BIRTH:  1923/10/21  DATE OF ADMISSION:  10/13/2015 ADMITTING PHYSICIAN: Shaune PollackQing Chen, MD  DATE OF DISCHARGE: 10/18/15  PRIMARY CARE PHYSICIAN: BABAOFF, MARC E, MD    ADMISSION DIAGNOSIS:  COPD exacerbation (HCC) [J44.1] Chronic congestive heart failure, unspecified congestive heart failure type (HCC) [I50.9]  DISCHARGE DIAGNOSIS:  Acute on chronic CHF diastolic Acute on CKD-III (creat today 1.44) Afib with tachycardia  SECONDARY DIAGNOSIS:   Past Medical History:  Diagnosis Date  . Atrial fibrillation (HCC)   . CHF (congestive heart failure) (HCC)   . Chronic kidney disease   . COPD (chronic obstructive pulmonary disease) (HCC)   . Hypertension   . Pacemaker     HOSPITAL COURSE:   82 year old female with past medical history of hypertension, COPD, chronic kidney disease stage III, history of CHF, atrial fibrillation who presents to the hospital due to shortness of breath.  1. CHF-acute on chronic diastolic dysfunction. -Patient's echocardiogram showed normal ejection fraction. -switch from IV Lasix to oral Lasix but will taper, about 4.0 L (-) since admission.  - Clinically improving. Appreciate Cardiology help.   -Continue Metoprolol 50 mg bid D/c coreg per Dr Cassie Freerparachos rec  2. Acute respiratory failure with hypoxia-secondary to CHF. -Continue management as mentioned above.  - pt qualifies for Home O2 upon discharge.   3. A. Fib w/ RVR - rates have been labile.   - discussed w/ Cardiology and switch from Coreg to Metoprolol tart. And titrate up as tolerated.  - have cards consider Long-term anticoagulation as outpatient.   4. COPD-no acute exacerbation. -Continue Dulera, Spiriva.  5. Hypokalemia- cont. To supplement -K 3.4  6. Acute on chronic renal failure-secondary to underlying CHF. -Baseline creatinine around 1.6. Now down to 1.4  today w/ diuresis.   Overall improving slowly. D/w d/c plan with vickie Ogas on the phone. All questions answered Arrange  HHPT/RN and oxygen D/c home  CONSULTS OBTAINED:  Treatment Team:  Lamar BlinksBruce J Kowalski, MD Marcina MillardAlexander Paraschos, MD  DRUG ALLERGIES:   Allergies  Allergen Reactions  . Codeine Nausea And Vomiting    DISCHARGE MEDICATIONS:   Current Discharge Medication List    START taking these medications   Details  metoprolol (LOPRESSOR) 50 MG tablet Take 1 tablet (50 mg total) by mouth 2 (two) times daily. Qty: 60 tablet, Refills: 1      CONTINUE these medications which have CHANGED   Details  furosemide (LASIX) 40 MG tablet Take 1 tablet (40 mg total) by mouth daily. Qty: 30 tablet, Refills: 0      CONTINUE these medications which have NOT CHANGED   Details  albuterol (PROVENTIL HFA;VENTOLIN HFA) 108 (90 Base) MCG/ACT inhaler Inhale 2 puffs into the lungs every 4 (four) hours as needed for wheezing or shortness of breath. Qty: 1 Inhaler, Refills: 2    aspirin EC 81 MG tablet Take 81 mg by mouth daily.     Calcium Carbonate-Vit D-Min (CALCIUM 600+D PLUS MINERALS) 600-400 MG-UNIT TABS Take 1 tablet by mouth daily.    Fluticasone-Salmeterol (ADVAIR DISKUS) 250-50 MCG/DOSE AEPB Inhale 1 puff into the lungs 2 (two) times daily. Qty: 60 each, Refills: 0    Glucosamine-Chondroitin 250-200 MG TABS Take 1 tablet by mouth daily.    lisinopril (PRINIVIL,ZESTRIL) 20 MG tablet Take 20 mg by mouth daily.     Multiple Vitamin (MULTI-VITAMINS) TABS Take 1 tablet by  mouth 2 (two) times daily.    potassium chloride SA (K-DUR,KLOR-CON) 20 MEQ tablet Take 20 mEq by mouth 2 (two) times daily.    tiotropium (SPIRIVA HANDIHALER) 18 MCG inhalation capsule Place 1 capsule (18 mcg total) into inhaler and inhale daily. Qty: 30 capsule, Refills: 0      STOP taking these medications     carvedilol (COREG) 12.5 MG tablet         If you experience worsening of your admission  symptoms, develop shortness of breath, life threatening emergency, suicidal or homicidal thoughts you must seek medical attention immediately by calling 911 or calling your MD immediately  if symptoms less severe.  You Must read complete instructions/literature along with all the possible adverse reactions/side effects for all the Medicines you take and that have been prescribed to you. Take any new Medicines after you have completely understood and accept all the possible adverse reactions/side effects.   Please note  You were cared for by a hospitalist during your hospital stay. If you have any questions about your discharge medications or the care you received while you were in the hospital after you are discharged, you can call the unit and asked to speak with the hospitalist on call if the hospitalist that took care of you is not available. Once you are discharged, your primary care physician will handle any further medical issues. Please note that NO REFILLS for any discharge medications will be authorized once you are discharged, as it is imperative that you return to your primary care physician (or establish a relationship with a primary care physician if you do not have one) for your aftercare needs so that they can reassess your need for medications and monitor your lab values. Today   SUBJECTIVE   Feels better. Denies palpitations  VITAL SIGNS:  Blood pressure 114/70, pulse (!) 119, temperature 98.2 F (36.8 C), temperature source Oral, resp. rate 16, height 4\' 11"  (1.499 m), weight 51.9 kg (114 lb 8 oz), SpO2 95 %.  I/O:   Intake/Output Summary (Last 24 hours) at 10/18/15 0806 Last data filed at 10/18/15 0754  Gross per 24 hour  Intake              600 ml  Output             1525 ml  Net             -925 ml    PHYSICAL EXAMINATION:  GENERAL:  82 y.o.-year-old patient lying in the bed with no acute distress.  EYES: Pupils equal, round, reactive to light and accommodation. No  scleral icterus. Extraocular muscles intact.  HEENT: Head atraumatic, normocephalic. Oropharynx and nasopharynx clear.  NECK:  Supple, no jugular venous distention. No thyroid enlargement, no tenderness.  LUNGS: Normal breath sounds bilaterally, no wheezing, rales,rhonchi or crepitation. No use of accessory muscles of respiration.  CARDIOVASCULAR: S1, S2 normal. No murmurs, rubs, or gallops.  ABDOMEN: Soft, non-tender, non-distended. Bowel sounds present. No organomegaly or mass.  EXTREMITIES: No pedal edema, cyanosis, or clubbing.  NEUROLOGIC: Cranial nerves II through XII are intact. Muscle strength 5/5 in all extremities. Sensation intact. Gait not checked.  PSYCHIATRIC: The patient is alert and oriented x 3.  SKIN: No obvious rash, lesion, or ulcer.   DATA REVIEW:   CBC   Recent Labs Lab 10/17/15 0414  WBC 6.9  HGB 14.4  HCT 43.7  PLT 114*    Chemistries   Recent Labs Lab 10/13/15 1157  10/17/15  1610 10/18/15 0534  NA 138  < > 142 139  K 5.8*  < > 3.1* 3.4*  CL 99*  < > 94* 93*  CO2 30  < > 38* 38*  GLUCOSE 141*  < > 100* 106*  BUN 71*  < > 71* 69*  CREATININE 2.25*  < > 1.51* 1.44*  CALCIUM 9.9  < > 8.9 9.2  MG 2.4  --  1.8  --   AST 39  --   --   --   ALT 24  --   --   --   ALKPHOS 63  --   --   --   BILITOT 1.1  --   --   --   < > = values in this interval not displayed.  Microbiology Results   No results found for this or any previous visit (from the past 240 hour(s)).  RADIOLOGY:  No results found.   Management plans discussed with the patient, family and they are in agreement.  CODE STATUS:     Code Status Orders        Start     Ordered   10/13/15 1550  Do not attempt resuscitation (DNR)  Continuous    Question Answer Comment  In the event of cardiac or respiratory ARREST Do not call a "code blue"   In the event of cardiac or respiratory ARREST Do not perform Intubation, CPR, defibrillation or ACLS   In the event of cardiac or respiratory  ARREST Use medication by any route, position, wound care, and other measures to relive pain and suffering. May use oxygen, suction and manual treatment of airway obstruction as needed for comfort.      10/13/15 1550    Code Status History    Date Active Date Inactive Code Status Order ID Comments User Context   07/31/2015  4:21 PM 08/02/2015  3:08 PM Full Code [id number]  Katharina Caper, MD Inpatient      TOTAL TIME TAKING CARE OF THIS PATIENT: 40 minutes.    Cambry Spampinato M.D on 10/18/2015 at 8:06 AM  Between 7am to 6pm - Pager - [phone] After 6pm go to [email or web address] - password [email or web address] [facility]  [place] [facility]  Office  [phone]  CC: Primary care physician; BABAOFF, Lavada Mesi, MD

## 2015-10-18 NOTE — Progress Notes (Signed)
PHARMACIST - PHYSICIAN ORDER COMMUNICATION  CONCERNING: P&T Medication Policy on Herbal Medications  DESCRIPTION:  This patient's order for:  Glucosamine-chondroitin  has been noted.  This product(s) is classified as an "herbal" or natural product. Due to a lack of definitive safety studies or FDA approval, nonstandard manufacturing practices, plus the potential risk of unknown drug-drug interactions while on inpatient medications, the Pharmacy and Therapeutics Committee does not permit the use of "herbal" or natural products of this type within [facility].   ACTION TAKEN: The pharmacy department is unable to verify this order at this time and your patient has been informed of this safety policy. Please reevaluate patient's clinical condition at discharge and address if the herbal or natural product(s) should be resumed at that time.  Cindi CarbonMary M Zayra Devito PharmD

## 2015-10-18 NOTE — Care Management Note (Signed)
Case Management Note  Patient Details  Name: Catheryn BaconBeverly J Halder MRN: [id number] Date of Birth: 1924-01-10  Subjective/Objective:      A request for home health PT, RN, Aide was faxed to [facility]. A request for new oxygen was faxed to [facility] with a request for [facility] and a new home oxygen set up for today.               Action/Plan:   Expected Discharge Date:                  Expected Discharge Plan:     In-House Referral:     Discharge planning Services     Post Acute Care Choice:    Choice offered to:     DME Arranged:    DME Agency:     HH Arranged:    HH Agency:     Status of Service:     If discussed at [employer] Length of Stay Meetings, dates discussed:    Additional Comments:  Cougar Imel A, RN 10/18/2015, 8:34 AM

## 2015-10-18 NOTE — Discharge Instructions (Signed)
Heart Failure Clinic appointment on October 31, 2015 at 11:00am with Clarisa Kindredina Hackney, FNP. Please call [phone] to reschedule.   Use your oxygen as before  HHRN/PT and aide

## 2015-10-18 NOTE — Progress Notes (Signed)
Patient being discharged to home with her daughter.  Case Management has set up Home RN, PT, and aide.  Oxygen is being delivered to the home and travel oxygen is being delivered to the room.  Patient continues to have a non productive cough, had emesis following a episode of deep coughing.  Says this is not unusual. IV dc's.  Tele DC's.

## 2015-10-31 ENCOUNTER — Ambulatory Visit: Payer: Medicare Other | Admitting: Family

## 2015-12-03 DEATH — deceased

## 2017-09-21 IMAGING — NM NM PULMONARY VENT & PERF
2 series · 16 of 16 positions shown · non-contrast
Comparison: Chest x-ray 08/01/2015 and chest CT 02/15/2011

CLINICAL DATA: Shortness of breath and cough 2- 3 weeks.  Smoker

EXAM:
NUCLEAR MEDICINE VENTILATION - PERFUSION LUNG SCAN
TECHNIQUE: Ventilation images were obtained in multiple projections using
inhaled aerosol Zc-DDm DTPA. Perfusion images were obtained in
multiple projections after intravenous injection of Zc-DDm MAA.
RADIOPHARMACEUTICALS:  32.0 mCi Rechnetium-XXm DTPA aerosol
inhalation and 4.2 mCi Rechnetium-XXm MAA IV

[Series 1000: lung ventilation · 3.90mm/px · 4 acquisitions, 8 frames shown]
[im 1/4]
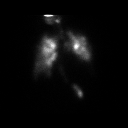
[im 1/4]
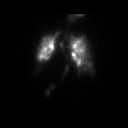
[im 2/4]
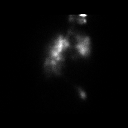
[im 2/4]
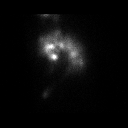
[im 3/4]
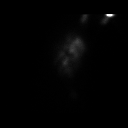
[im 3/4]
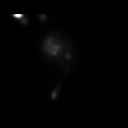
[im 4/4]
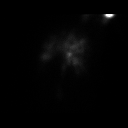
[im 4/4]
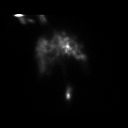

[Series 1000: lung perfusion · 1.95mm/px · 4 acquisitions, 8 frames shown]
[im 1/4]
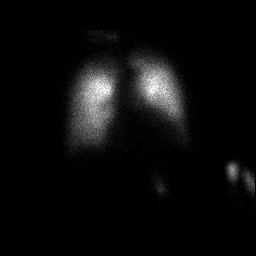
[im 1/4]
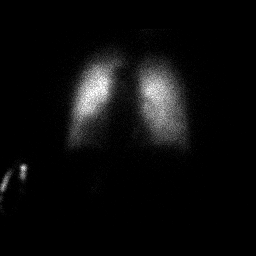
[im 2/4]
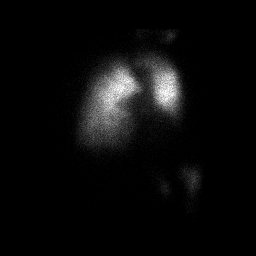
[im 2/4]
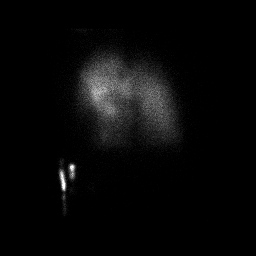
[im 3/4]
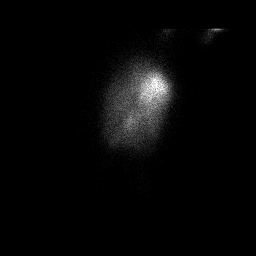
[im 3/4]
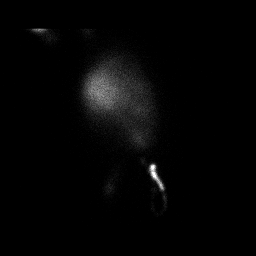
[im 4/4]
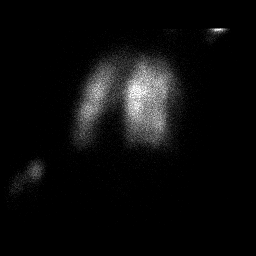
[im 4/4]
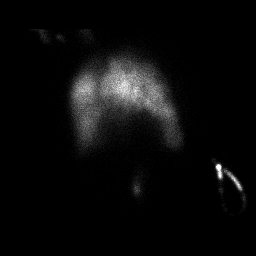

[16 of 16 positions shown; findings below may reference images not displayed]

FINDINGS: Ventilation: Examination demonstrates moderate bilateral
heterogeneous uptake worse of the perihilar regions/central airways.

Perfusion: Mild heterogeneous uptake bilaterally worse over the
perihilar/central lungs. No definite focal peripheral which
segmental/ subsegmental defect.

Chest x-ray demonstrates no focal consolidation or significant
effusion. Moderate cardiomegaly.
IMPRESSION: Findings compatible with low probability for pulmonary embolism.

## 2017-09-21 IMAGING — DX DG CHEST 1V
1 series · 1 of 1 positions shown · non-contrast
Comparison: 07/31/2015

CLINICAL DATA: CHF.  COPD.

EXAM:
CHEST 1 VIEW

[chest ap]
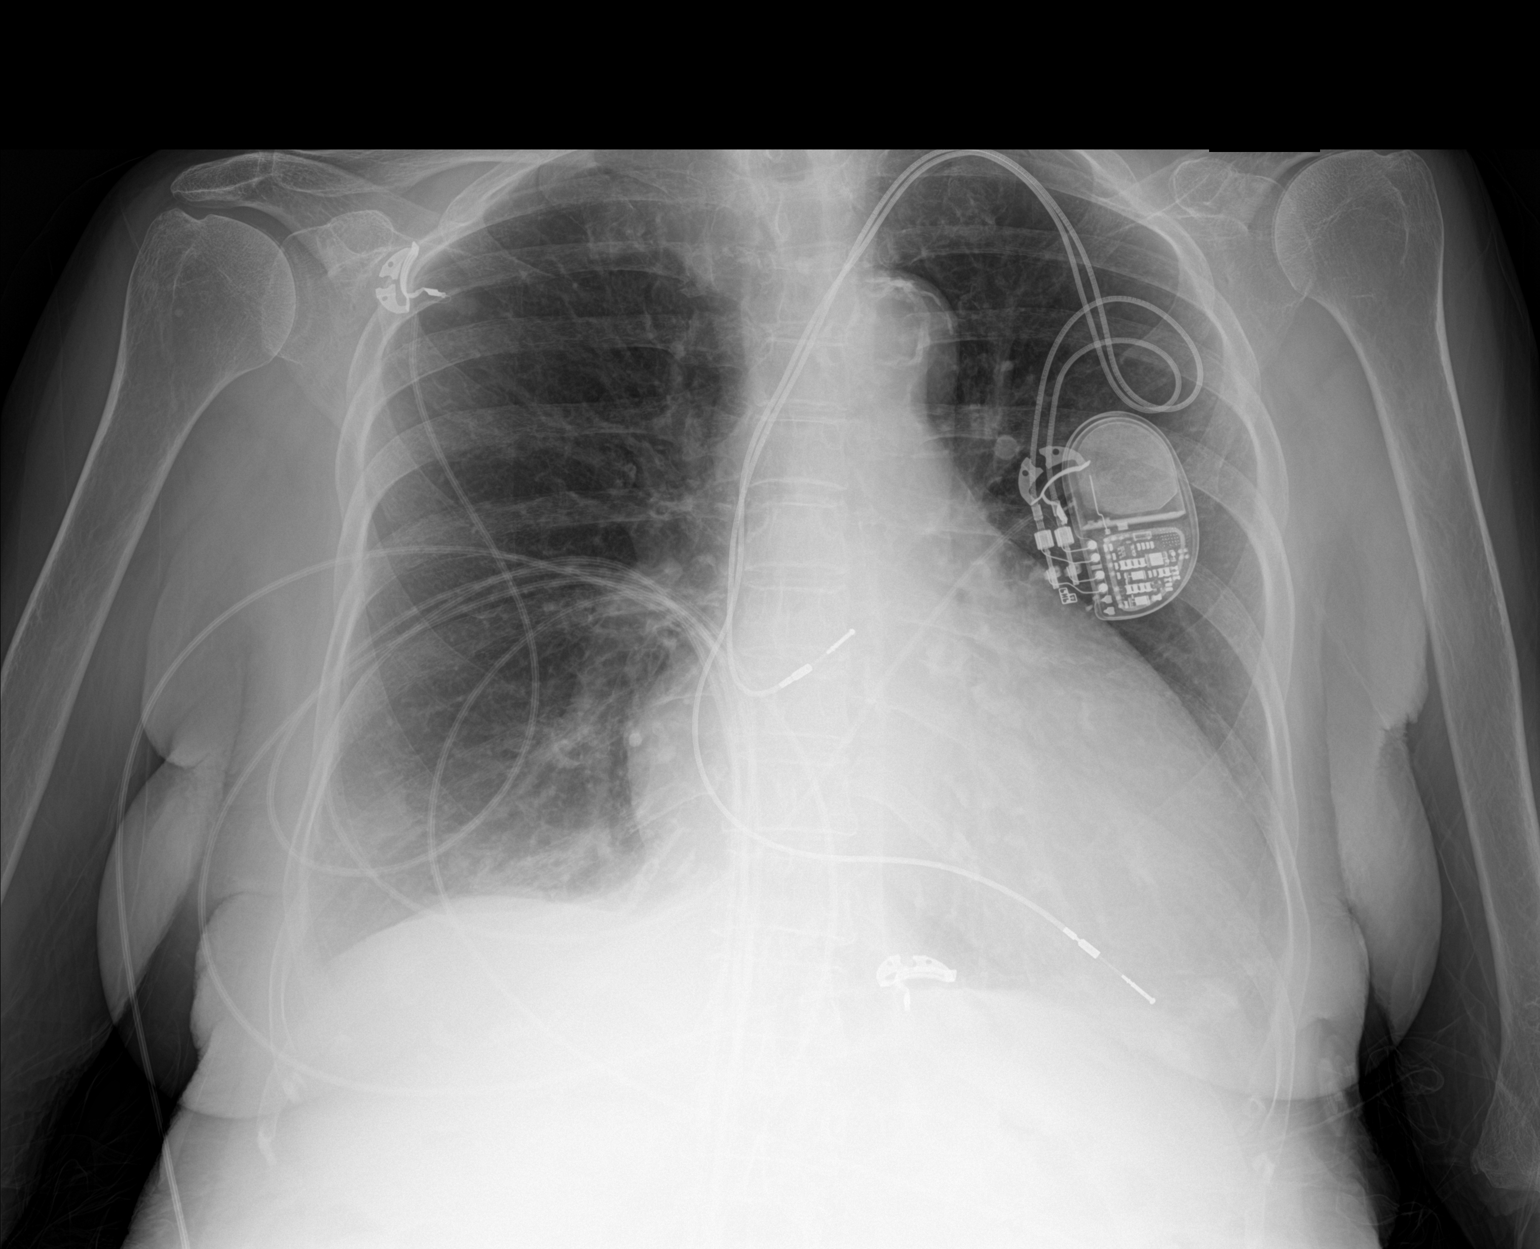

[1 of 1 positions shown; findings below may reference images not displayed]

FINDINGS: Left pacer remains in place, unchanged. Cardiomegaly. Small
bilateral pleural effusions with bibasilar atelectasis. No overt
edema.
IMPRESSION: Cardiomegaly.

Small effusions with bibasilar atelectasis.
